# Patient Record
Sex: Male | Born: 1940 | Race: White | Hispanic: No | Smoking: Former smoker
Health system: Southern US, Community
[De-identification: ages and names within clinical notes are randomized; demographics above are authoritative.]

## PROBLEM LIST (undated history)

## (undated) DIAGNOSIS — N529 Male erectile dysfunction, unspecified: Secondary | ICD-10-CM

## (undated) DIAGNOSIS — K5792 Diverticulitis of intestine, part unspecified, without perforation or abscess without bleeding: Secondary | ICD-10-CM

## (undated) DIAGNOSIS — I10 Essential (primary) hypertension: Secondary | ICD-10-CM

## (undated) DIAGNOSIS — J342 Deviated nasal septum: Secondary | ICD-10-CM

## (undated) DIAGNOSIS — E7800 Pure hypercholesterolemia, unspecified: Secondary | ICD-10-CM

## (undated) DIAGNOSIS — N400 Enlarged prostate without lower urinary tract symptoms: Secondary | ICD-10-CM

## (undated) DIAGNOSIS — R350 Frequency of micturition: Secondary | ICD-10-CM

## (undated) DIAGNOSIS — I700 Atherosclerosis of aorta: Secondary | ICD-10-CM

## (undated) DIAGNOSIS — C61 Malignant neoplasm of prostate: Secondary | ICD-10-CM

## (undated) DIAGNOSIS — I779 Disorder of arteries and arterioles, unspecified: Secondary | ICD-10-CM

## (undated) HISTORY — PX: OTHER SURGICAL HISTORY: SHX169

## (undated) HISTORY — PX: VARIOCOCELE REPAIR: SHX242

## (undated) HISTORY — PX: TONSILLECTOMY: SUR1361

## (undated) HISTORY — DX: Malignant neoplasm of prostate: C61

## (undated) HISTORY — DX: Male erectile dysfunction, unspecified: N52.9

---

## 2000-12-20 ENCOUNTER — Ambulatory Visit (HOSPITAL_COMMUNITY): Admission: RE | Admit: 2000-12-20 | Discharge: 2000-12-20 | Payer: Self-pay | Admitting: Gastroenterology

## 2005-07-19 ENCOUNTER — Emergency Department (HOSPITAL_COMMUNITY): Admission: EM | Admit: 2005-07-19 | Discharge: 2005-07-20 | Payer: Self-pay | Admitting: Emergency Medicine

## 2006-04-26 ENCOUNTER — Encounter: Admission: RE | Admit: 2006-04-26 | Discharge: 2006-04-26 | Payer: Self-pay | Admitting: Internal Medicine

## 2012-04-14 ENCOUNTER — Other Ambulatory Visit: Payer: Self-pay | Admitting: Internal Medicine

## 2012-04-14 DIAGNOSIS — R109 Unspecified abdominal pain: Secondary | ICD-10-CM

## 2012-04-15 ENCOUNTER — Ambulatory Visit
Admission: RE | Admit: 2012-04-15 | Discharge: 2012-04-15 | Disposition: A | Discharge status: Home / Self Care | Payer: Medicare Other | Source: Ambulatory Visit | Attending: Internal Medicine | Admitting: Internal Medicine

## 2012-04-15 ENCOUNTER — Other Ambulatory Visit: Payer: Self-pay | Admitting: Internal Medicine

## 2012-04-15 DIAGNOSIS — R109 Unspecified abdominal pain: Secondary | ICD-10-CM

## 2012-04-15 MED ORDER — IOHEXOL 300 MG/ML  SOLN
100.0000 mL | Freq: Once | INTRAMUSCULAR | Status: AC | PRN
  Administered 2012-04-15: 100 mL via INTRAVENOUS

## 2012-06-28 DIAGNOSIS — C61 Malignant neoplasm of prostate: Secondary | ICD-10-CM

## 2012-06-28 HISTORY — DX: Malignant neoplasm of prostate: C61

## 2012-06-28 HISTORY — PX: PROSTATE BIOPSY: SHX241

## 2012-07-25 ENCOUNTER — Encounter: Payer: Self-pay | Admitting: Radiation Oncology

## 2012-07-26 ENCOUNTER — Ambulatory Visit
Admission: RE | Admit: 2012-07-26 | Discharge: 2012-07-26 | Disposition: A | Discharge status: Home / Self Care | Payer: Medicare Other | Source: Ambulatory Visit | Attending: Radiation Oncology | Admitting: Radiation Oncology

## 2012-07-26 ENCOUNTER — Encounter: Payer: Self-pay | Admitting: Radiation Oncology

## 2012-07-26 VITALS — BP 124/64 | HR 87 | Temp 98.9°F | Resp 20 | Ht 70.5 in | Wt 161.8 lb

## 2012-07-26 DIAGNOSIS — C61 Malignant neoplasm of prostate: Secondary | ICD-10-CM | POA: Insufficient documentation

## 2012-07-26 HISTORY — DX: Essential (primary) hypertension: I10

## 2012-07-26 HISTORY — DX: Pure hypercholesterolemia, unspecified: E78.00

## 2012-07-26 HISTORY — DX: Benign prostatic hyperplasia without lower urinary tract symptoms: N40.0

## 2012-07-26 NOTE — Progress Notes (Signed)
Please see the Nurse Progress Note in the MD Initial Consult Encounter for this patient. 

## 2012-07-26 NOTE — Progress Notes (Signed)
New Consult Prostate Cancer Biopsy 06/28/2012 Adenocarcinoma,gleason 3+4=-7 & 4+3=7, PSA=2.94,Volume=60.4cc PSA 05/28/11=2.07  Divorced,Retired, 1 son, alert,orientd x3, nocturia 2-3x  Doesn't feel emptying his bladder fully   Brother Prostate Cancer  Age 73,  Dx and treated with surgery  1,  2013 Mother unknown cancer (pelvic area?)  Allergies:Sulfa drugs,

## 2012-07-26 NOTE — Progress Notes (Signed)
[facility] Radiation Oncology NEW PATIENT EVALUATION  Name: Wesley Stanton MRN: [id number]  Date:   07/26/2012           DOB: 08/29/1940  Status: outpatient   CC:   Crecencio Mc, MD Dr. 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 male who is seen today for the courtesy of Dr. 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 He was followed by Dr. Wanda Plump for a number of years with BPH and lower urinary tract symptoms. He was noted to have a slight rise in his PSA from 2.07 in October 2012 to 2.94 in October of 2013. Dr. Laverle Patter was concerned that he knew prostate nodule towards the left medial/apex region. He underwent ultrasound-guided biopsies on 06/28/2012, was found have Gleason 7 (4+3) involving 50% of one core from the left base and Gleason 7 (3+4) involving 60% of one core from the left lateral base. High-grade PIN was seen from the right lateral apex and also left mid gland. His gland volume was 60.4 cc. He does have significant obstructive urinary symptoms despite taking doxazosin appear his I PSS score today is 22 (21 at Dr. Vevelyn Royals office). Doing well from a GI standpoint. He does have erectile dysfunction which can improve somewhat with Viagra. 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  PREVIOUS RADIATION THERAPY: No   PAST MEDICAL HISTORY:  has a past medical history of Prostate cancer (06/28/12); ED (erectile dysfunction); Hypertension; Hypercholesterolemia; and BPH (benign prostatic hyperplasia).     PAST SURGICAL HISTORY:  Past Surgical History  Procedure Date  . Prostate biopsy 06/28/12    Adenocarcinoma  . Tonsillectomy     as a child  . Variococele repair     [phone] repair     FAMILY HISTORY: His father died of "old age" at age 73. His mother died  following a stroke at 19. He believes that his father may have had some type of prostate problem but it is unclear as to whether not he had prostate cancer.  SOCIAL HISTORY:  reports that he has quit smoking. His smoking use included Cigarettes. He has a 10 pack-year smoking history. He does not have any smokeless tobacco history on file. He reports that he drinks alcohol. Divorced, one son. He worked as a Programme researcher, broadcasting/film/video.   ALLERGIES: Sulfa antibiotics   MEDICATIONS:  Current Outpatient Prescriptions  Medication Sig Dispense Refill  . aspirin 81 MG tablet Take 81 mg by mouth daily.      Marland Kitchen doxazosin (CARDURA) 8 MG tablet Take 8 mg by mouth at bedtime.       Marland Kitchen lisinopril (PRINIVIL,ZESTRIL) 10 MG tablet Take 10 mg by mouth daily.       . simvastatin (ZOCOR) 40 MG tablet Take 40 mg by mouth at bedtime.       . meloxicam (MOBIC) 15 MG tablet          REVIEW OF SYSTEMS:  Pertinent items are noted in HPI.    PHYSICAL EXAM:  height is 5' 10.5" (1.791 m) and weight is 161 lb 12.8 oz (73.392 kg). His oral temperature is 98.9 F (37.2 C). His blood pressure is 124/64 and his pulse is 87. His respiration is 20.      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 I explained to the patient that his prognosis is related to his stage, Gleason score, and PSA level. His stage and PSA level are favorable while his Gleason score of 7 is of intermediate favorability. Other prognostic factors include disease volume and PSA doubling time, and these are also favorable. His management options include surgery versus radiation therapy. I do not feel that he would be a good candidate for active surveillance in view of his Gleason 7 (4+3). He appears to have  a significant life expectancy. Radiation therapy options include possible seed implantation or external beam/IMRT. We discussed the potential acute and late toxicities of radiation therapy. I do not feel that he is a good candidate for seed implantation based on his obstructive symptomatology which would only worsen, at least temporarily and place him at risk for going into urinary retention. Even with external beam radiation therapy I think it would be quite symptomatic from a urinary obstructive standpoint and may end up in urinary retention as well. The patient is quite young, physiologically, I feel that he would have a better urinary outcome, and similar cure rate with robotic surgery with Dr. Laverle Patter compared to external beam/IMRT. The patient will get back in touch with Dr. Laverle Patter regarding his decision which believe will be robotic surgery. She also understands that there is a chance that he may need to have post operative radiation therapy should he be found to be at increased risk for local recurrence based on his pathologic stage.   PLAN: As discussed above.   I spent 60 minutes minutes face to face with the patient and more than 50% of that time was spent in counseling and/or coordination of care.

## 2012-07-28 ENCOUNTER — Other Ambulatory Visit: Payer: Self-pay | Admitting: Urology

## 2012-07-29 NOTE — Addendum Note (Signed)
Encounter addended by: Alyah Boehning Mintz Kayliah Tindol, RN on: 07/29/2012  6:17 PM<BR>     Documentation filed: Charges VN

## 2012-08-01 ENCOUNTER — Encounter (HOSPITAL_COMMUNITY): Payer: Self-pay

## 2012-08-04 ENCOUNTER — Encounter (HOSPITAL_COMMUNITY): Payer: Self-pay

## 2012-08-04 ENCOUNTER — Ambulatory Visit (HOSPITAL_COMMUNITY)
Admission: RE | Admit: 2012-08-04 | Discharge: 2012-08-04 | Disposition: A | Discharge status: Home / Self Care | Payer: Medicare Other | Source: Ambulatory Visit | Attending: Urology | Admitting: Urology

## 2012-08-04 ENCOUNTER — Encounter (HOSPITAL_COMMUNITY)
Admission: RE | Admit: 2012-08-04 | Discharge: 2012-08-04 | Disposition: A | Discharge status: Home / Self Care | Payer: Medicare Other | Source: Ambulatory Visit | Attending: Urology | Admitting: Urology

## 2012-08-04 DIAGNOSIS — Z01818 Encounter for other preprocedural examination: Secondary | ICD-10-CM | POA: Insufficient documentation

## 2012-08-04 HISTORY — DX: Diverticulitis of intestine, part unspecified, without perforation or abscess without bleeding: K57.92

## 2012-08-04 HISTORY — DX: Frequency of micturition: R35.0

## 2012-08-04 HISTORY — DX: Deviated nasal septum: J34.2

## 2012-08-04 LAB — CBC
HCT: 42.9 % (ref 39.0–52.0)
Hemoglobin: 14.9 g/dL (ref 13.0–17.0)
MCH: 32.8 pg (ref 26.0–34.0)
MCHC: 34.7 g/dL (ref 30.0–36.0)
MCV: 94.5 fL (ref 78.0–100.0)
Platelets: 145 10*3/uL — ABNORMAL LOW (ref 150–400)
RBC: 4.54 MIL/uL (ref 4.22–5.81)
RDW: 12.2 % (ref 11.5–15.5)
WBC: 6.9 10*3/uL (ref 4.0–10.5)

## 2012-08-04 LAB — BASIC METABOLIC PANEL
BUN: 13 mg/dL (ref 6–23)
CO2: 27 mEq/L (ref 19–32)
Calcium: 9.9 mg/dL (ref 8.4–10.5)
Chloride: 100 mEq/L (ref 96–112)
Creatinine, Ser: 0.79 mg/dL (ref 0.50–1.35)
GFR calc Af Amer: >90 mL/min (ref >90)
GFR calc non Af Amer: 87 mL/min — ABNORMAL LOW (ref >90)
Glucose, Bld: 106 mg/dL — ABNORMAL HIGH (ref 70–99)
Potassium: 4.6 mEq/L (ref 3.5–5.1)
Sodium: 135 mEq/L (ref 135–145)

## 2012-08-04 LAB — SURGICAL PCR SCREEN
MRSA, PCR: NEGATIVE
Staphylococcus aureus: NEGATIVE

## 2012-08-04 NOTE — Pre-Procedure Instructions (Signed)
PREOP CBC, BMET, EKG, CXR WERE DONE TODAY AT [facility] PREOP - AS PER ORDERS DR. BORDEN.  T/S TO BE DONE ON ARRIVAL FOR SURGERY.

## 2012-08-04 NOTE — Patient Instructions (Addendum)
YOUR SURGERY IS SCHEDULED AT [facility]  ON:  Thursday  JAN 2nd  REPORT TO [facility]ORT STAY C[facility] AT: 5:15 AM      PHONE # FOR SHORT STAY IS [phone]  FOLLOW BOWEL PREP INSTRUCTIONS / CLEAR LIQUIDS DAY BEFORE SURGERY --INSTRUCTIONS WERE GIVEN BY DR. Vevelyn Royals OFFICE.  DO NOT EAT OR DRINK ANYTHING AFTER MIDNIGHT THE NIGHT BEFORE YOUR SURGERY.  YOU MAY BRUSH YOUR TEETH, RINSE OUT YOUR MOUTH--BUT NO WATER, NO FOOD, NO CHEWING GUM, NO MINTS, NO CANDIES, NO CHEWING TOBACCO.  PLEASE TAKE THE FOLLOWING MEDICATIONS THE AM OF YOUR SURGERY WITH A FEW SIPS OF WATER: NO MEDICINES TO TAKE.  IF YOU USE INHALERS--USE YOUR INHALERS THE AM OF YOUR SURGERY AND BRING INHALERS TO THE HOSPITAL -TAKE TO SURGERY.    IF YOU ARE DIABETIC:  DO NOT TAKE ANY DIABETIC MEDICATIONS THE AM OF YOUR SURGERY.  IF YOU TAKE INSULIN IN THE EVENINGS--PLEASE ONLY TAKE 1/2 NORMAL EVENING DOSE THE NIGHT BEFORE YOUR SURGERY.  NO INSULIN THE AM OF YOUR SURGERY.  IF YOU HAVE SLEEP APNEA AND USE CPAP OR BIPAP--PLEASE BRING THE MASK AND THE TUBING.  DO NOT BRING YOUR MACHINE.  DO NOT BRING VALUABLES, MONEY, CREDIT CARDS.  DO NOT WEAR JEWELRY, MAKE-UP, NAIL POLISH AND NO METAL PINS OR CLIPS IN YOUR HAIR. CONTACT LENS, DENTURES / PARTIALS, GLASSES SHOULD NOT BE WORN TO SURGERY AND IN MOST CASES-HEARING AIDS WILL NEED TO BE REMOVED.  BRING YOUR GLASSES CASE, ANY EQUIPMENT NEEDED FOR YOUR CONTACT LENS. FOR PATIENTS ADMITTED TO THE HOSPITAL--CHECK OUT TIME THE DAY OF DISCHARGE IS 11:00 AM.  ALL INPATIENT ROOMS ARE PRIVATE - WITH BATHROOM, TELEPHONE, TELEVISION AND WIFI INTERNET.  IF YOU ARE BEING DISCHARGED THE SAME DAY OF YOUR SURGERY--YOU CAN NOT DRIVE YOURSELF HOME--AND SHOULD NOT GO HOME ALONE BY TAXI OR BUS.  NO DRIVING OR OPERATING MACHINERY FOR 24 HOURS FOLLOWING ANESTHESIA / PAIN MEDICATIONS.  PLEASE MAKE ARRANGEMENTS FOR SOMEONE TO BE WITH YOU AT HOME THE FIRST 24 HOURS AFTER SURGERY. RESPONSIBLE DRIVER'S  NAME___________________________                                               PHONE #   _______________________                               PLEASE READ OVER ANY  FACT SHEETS THAT YOU WERE GIVEN: MRSA INFORMATION, BLOOD TRANSFUSION INFORMATION, INCENTIVE SPIROMETER INFORMATION. FAILURE TO FOLLOW THESE INSTRUCTIONS MAY RESULT IN THE CANCELLATION OF YOUR SURGERY.   PATIENT SIGNATURE_________________________________

## 2012-08-10 MED ORDER — CEFAZOLIN SODIUM-DEXTROSE 2-3 GM-% IV SOLR
2.0000 g | INTRAVENOUS | Status: AC
  Administered 2012-08-11: 2 g via INTRAVENOUS

## 2012-08-10 NOTE — H&P (Signed)
  History of Present Illness  Wesley Stanton is a 72 year old previously followed by Dr. Wanda Plump with the following urologic history:  1) Prostate Cancer: He was noted to have a new prostate nodule at the left mid prostate gland in October 2013 prompting a prostate biopsy which confirmed Gleason 4+3=7 adenocarcinoma with 2 out of 12 biopsy cores positive with both positive cores on the left side of the prostate. He has a family history of prostate cancer with his brother having been diagnosed and treated in 2013. He does have a history of BPH with moderate symptoms on maximum alpha blocker therapy. He is exceedingly healthy and active.  TNM stage: cT2a Nx Mx PSA: 2.94 Gleason score: 4+3=7 Biopsy (06/28/12): 2/12 cores positive Prostate volume: 60.4 cc  Nomogram OC disease: 73% EPE: 35% SVI: 1% LNI: 2.1% PFS (surgery): 95%, 93%  Urinary function: IPSS is 21 on doxazosin 8 mg. Erectile function:  2) BPH/LUTS: He has long-standing symptoms which at baseline include a sense of incomplete emptying, urinary frequency, urgency, weak stream, and nocturia. Nocturia is his most bothersome symptom at baseline he gets up 3-4 times at night.  Current treatment: Doxazosin 8 mg Prior treatment: Tamsulosin (discontinued due to GI side effects)  3) Microscopic hematuria: He underwent a complete urologic evaluation by Dr. Wanda Plump in 2006 which was negative.  4) Erectile dysfunction: He has mild erectile dysfunction and occasionally takes Viagra.  5) Varicocele/infertility: He is status post a varicocele repair in the 1970s and subsequently conceived following this repair.    Past Medical History Problems  1. History of  Glaucoma 365.9 2. History of  Hypercholesterolemia 272.0 3. History of  Hypertension 401.9  Surgical History Problems  1. History of  Surgery Spermatic Cord Excision Of Varicocele  Current Meds 1. Adult Aspirin Low Strength 81 MG Oral Tablet Dispersible; Therapy:  (Recorded:20Dec2007) to 2. Doxazosin Mesylate 8 MG Oral Tablet; TAKE ONE TABLET BY MOUTH AT BEDTIME; Therapy:  14Nov2011 to (Evaluate:09Oct2014)  Requested for: 14Oct2013; Last Rx:14Oct2013 3. Lisinopril 10 MG Oral Tablet; Therapy: (Recorded:26Oct2011) to 4. Simvastatin 40 MG Oral Tablet; Therapy: 14Mar2011 to  Allergies Medication  1. Sulfa Drugs  Family History Problems  1. Paternal history of  Nephrolithiasis 2. Fraternal history of  Prostate Cancer V16.42 3. Maternal history of  Uterine Cancer  Social History Problems  1. Alcohol Use Wine 3/day 2. Marital History - Divorced 3. Occupation: Retired 4. Tobacco Use V15.82 Social, quit 20+ yrs. ago  Vitals Vital Signs [Data Includes: Last 1 Day]  26Nov2013 01:11PM  Blood Pressure: 121 / 65 Heart Rate: 76  Physical Exam Constitutional: Well nourished and well developed . No acute distress.     Discussion/Summary  1. Clinically localized prostate cancer:  He will undergo a robotic assisted laparoscopic radical prostatectomy with BPLND.

## 2012-08-11 ENCOUNTER — Encounter (HOSPITAL_COMMUNITY): Payer: Self-pay | Admitting: *Deleted

## 2012-08-11 ENCOUNTER — Encounter (HOSPITAL_COMMUNITY)
Admission: RE | Disposition: A | Discharge status: Home / Self Care | Payer: Self-pay | Source: Ambulatory Visit | Attending: Urology

## 2012-08-11 ENCOUNTER — Observation Stay (HOSPITAL_COMMUNITY)
Admission: RE | Admit: 2012-08-11 | Discharge: 2012-08-12 | Disposition: A | Discharge status: Home / Self Care | Payer: Medicare Other | Source: Ambulatory Visit | Attending: Urology | Admitting: Urology

## 2012-08-11 ENCOUNTER — Encounter (HOSPITAL_COMMUNITY): Payer: Self-pay | Admitting: Certified Registered Nurse Anesthetist

## 2012-08-11 ENCOUNTER — Inpatient Hospital Stay (HOSPITAL_COMMUNITY): Payer: Medicare Other | Admitting: Certified Registered Nurse Anesthetist

## 2012-08-11 DIAGNOSIS — C61 Malignant neoplasm of prostate: Principal | ICD-10-CM | POA: Insufficient documentation

## 2012-08-11 DIAGNOSIS — R3129 Other microscopic hematuria: Secondary | ICD-10-CM | POA: Insufficient documentation

## 2012-08-11 DIAGNOSIS — Z79899 Other long term (current) drug therapy: Secondary | ICD-10-CM | POA: Insufficient documentation

## 2012-08-11 DIAGNOSIS — N401 Enlarged prostate with lower urinary tract symptoms: Secondary | ICD-10-CM | POA: Insufficient documentation

## 2012-08-11 DIAGNOSIS — H409 Unspecified glaucoma: Secondary | ICD-10-CM | POA: Insufficient documentation

## 2012-08-11 DIAGNOSIS — Z7982 Long term (current) use of aspirin: Secondary | ICD-10-CM | POA: Insufficient documentation

## 2012-08-11 DIAGNOSIS — E7800 Pure hypercholesterolemia, unspecified: Secondary | ICD-10-CM | POA: Insufficient documentation

## 2012-08-11 DIAGNOSIS — I10 Essential (primary) hypertension: Secondary | ICD-10-CM | POA: Insufficient documentation

## 2012-08-11 DIAGNOSIS — N529 Male erectile dysfunction, unspecified: Secondary | ICD-10-CM | POA: Insufficient documentation

## 2012-08-11 DIAGNOSIS — N138 Other obstructive and reflux uropathy: Secondary | ICD-10-CM | POA: Insufficient documentation

## 2012-08-11 HISTORY — PX: ROBOT ASSISTED LAPAROSCOPIC RADICAL PROSTATECTOMY: SHX5141

## 2012-08-11 HISTORY — PX: LYMPHADENECTOMY: SHX5960

## 2012-08-11 LAB — TYPE AND SCREEN
ABO/RH(D): A NEG
Antibody Screen: NEGATIVE

## 2012-08-11 LAB — HEMOGLOBIN AND HEMATOCRIT, BLOOD
HCT: 35.9 % — ABNORMAL LOW (ref 39.0–52.0)
Hemoglobin: 12.9 g/dL — ABNORMAL LOW (ref 13.0–17.0)

## 2012-08-11 LAB — ABO/RH: ABO/RH(D): A NEG

## 2012-08-11 SURGERY — ROBOTIC ASSISTED LAPAROSCOPIC RADICAL PROSTATECTOMY LEVEL 2
Anesthesia: General | Site: Prostate | Wound class: Clean Contaminated

## 2012-08-11 MED ORDER — DOCUSATE SODIUM 100 MG PO CAPS
100.0000 mg | ORAL_CAPSULE | Freq: Two times a day (BID) | ORAL | Status: DC
  Administered 2012-08-11 – 2012-08-12 (×2): 100 mg via ORAL
  Filled 2012-08-11 (×3): qty 1

## 2012-08-11 MED ORDER — PROMETHAZINE HCL 25 MG/ML IJ SOLN: 6.2500 mg | INTRAMUSCULAR | Status: DC | PRN

## 2012-08-11 MED ORDER — CIPROFLOXACIN HCL 500 MG PO TABS: 500.0000 mg | ORAL_TABLET | Freq: Two times a day (BID) | ORAL | Status: DC

## 2012-08-11 MED ORDER — LACTATED RINGERS IV SOLN
INTRAVENOUS | Status: DC | PRN
  Administered 2012-08-11: 07:00:00 via INTRAVENOUS

## 2012-08-11 MED ORDER — PROPOFOL 10 MG/ML IV BOLUS
INTRAVENOUS | Status: DC | PRN
  Administered 2012-08-11: 160 mg via INTRAVENOUS

## 2012-08-11 MED ORDER — LACTATED RINGERS IV SOLN
INTRAVENOUS | Status: DC | PRN
  Administered 2012-08-11: 08:00:00

## 2012-08-11 MED ORDER — MIDAZOLAM HCL 5 MG/5ML IJ SOLN
INTRAMUSCULAR | Status: DC | PRN
  Administered 2012-08-11: 2 mg via INTRAVENOUS

## 2012-08-11 MED ORDER — KCL IN DEXTROSE-NACL 20-5-0.45 MEQ/L-%-% IV SOLN
INTRAVENOUS | Status: DC
  Administered 2012-08-11 (×2): via INTRAVENOUS
  Administered 2012-08-11: 1000 mL via INTRAVENOUS
  Administered 2012-08-12: 06:00:00 via INTRAVENOUS
  Filled 2012-08-11 (×5): qty 1000

## 2012-08-11 MED ORDER — MORPHINE SULFATE 2 MG/ML IJ SOLN: 2.0000 mg | INTRAMUSCULAR | Status: DC | PRN

## 2012-08-11 MED ORDER — BUPIVACAINE-EPINEPHRINE 0.25% -1:200000 IJ SOLN
INTRAMUSCULAR | Status: DC | PRN
  Administered 2012-08-11: 27 mL

## 2012-08-11 MED ORDER — HYDROMORPHONE HCL PF 1 MG/ML IJ SOLN
0.2500 mg | INTRAMUSCULAR | Status: DC | PRN
  Administered 2012-08-11 (×4): 0.5 mg via INTRAVENOUS

## 2012-08-11 MED ORDER — HYDROMORPHONE HCL PF 1 MG/ML IJ SOLN
INTRAMUSCULAR | Status: DC | PRN
  Administered 2012-08-11 (×2): .5 mg via INTRAVENOUS
  Administered 2012-08-11: 1 mg via INTRAVENOUS

## 2012-08-11 MED ORDER — SODIUM CHLORIDE 0.9 % IV BOLUS (SEPSIS)
1000.0000 mL | Freq: Once | INTRAVENOUS | Status: AC
  Administered 2012-08-11: 1000 mL via INTRAVENOUS

## 2012-08-11 MED ORDER — NEOSTIGMINE METHYLSULFATE 1 MG/ML IJ SOLN
INTRAMUSCULAR | Status: DC | PRN
  Administered 2012-08-11: 4 mg via INTRAVENOUS

## 2012-08-11 MED ORDER — EPHEDRINE SULFATE 50 MG/ML IJ SOLN
INTRAMUSCULAR | Status: DC | PRN
  Administered 2012-08-11 (×2): 5 mg via INTRAVENOUS

## 2012-08-11 MED ORDER — ACETAMINOPHEN 10 MG/ML IV SOLN
INTRAVENOUS | Status: DC | PRN
  Administered 2012-08-11: 1000 mg via INTRAVENOUS

## 2012-08-11 MED ORDER — DIPHENHYDRAMINE HCL 12.5 MG/5ML PO ELIX: 12.5000 mg | ORAL_SOLUTION | Freq: Four times a day (QID) | ORAL | Status: DC | PRN

## 2012-08-11 MED ORDER — ROCURONIUM BROMIDE 100 MG/10ML IV SOLN
INTRAVENOUS | Status: DC | PRN
  Administered 2012-08-11: 10 mg via INTRAVENOUS
  Administered 2012-08-11: 50 mg via INTRAVENOUS

## 2012-08-11 MED ORDER — INDIGOTINDISULFONATE SODIUM 8 MG/ML IJ SOLN
INTRAMUSCULAR | Status: DC | PRN
  Administered 2012-08-11 (×2): 5 mL via INTRAVENOUS

## 2012-08-11 MED ORDER — STERILE WATER FOR IRRIGATION IR SOLN
Status: DC | PRN
  Administered 2012-08-11: 1500 mL

## 2012-08-11 MED ORDER — DOXAZOSIN MESYLATE 8 MG PO TABS
8.0000 mg | ORAL_TABLET | Freq: Every day | ORAL | Status: DC
  Administered 2012-08-11: 8 mg via ORAL
  Filled 2012-08-11 (×2): qty 1

## 2012-08-11 MED ORDER — GLYCOPYRROLATE 0.2 MG/ML IJ SOLN
INTRAMUSCULAR | Status: DC | PRN
  Administered 2012-08-11: 0.6 mg via INTRAVENOUS

## 2012-08-11 MED ORDER — HYDROCODONE-ACETAMINOPHEN 5-325 MG PO TABS: 1.0000 | ORAL_TABLET | Freq: Four times a day (QID) | ORAL | Status: DC | PRN

## 2012-08-11 MED ORDER — ACETAMINOPHEN 325 MG PO TABS
650.0000 mg | ORAL_TABLET | ORAL | Status: DC | PRN
  Administered 2012-08-12: 650 mg via ORAL
  Filled 2012-08-11: qty 2

## 2012-08-11 MED ORDER — SIMVASTATIN 40 MG PO TABS
40.0000 mg | ORAL_TABLET | Freq: Every day | ORAL | Status: DC
  Administered 2012-08-11: 40 mg via ORAL
  Filled 2012-08-11 (×2): qty 1

## 2012-08-11 MED ORDER — LIDOCAINE HCL (CARDIAC) 20 MG/ML IV SOLN
INTRAVENOUS | Status: DC | PRN
  Administered 2012-08-11: 80 mg via INTRAVENOUS

## 2012-08-11 MED ORDER — FENTANYL CITRATE 0.05 MG/ML IJ SOLN
INTRAMUSCULAR | Status: DC | PRN
  Administered 2012-08-11 (×5): 50 ug via INTRAVENOUS

## 2012-08-11 MED ORDER — DIPHENHYDRAMINE HCL 50 MG/ML IJ SOLN: 12.5000 mg | Freq: Four times a day (QID) | INTRAMUSCULAR | Status: DC | PRN

## 2012-08-11 MED ORDER — KETOROLAC TROMETHAMINE 15 MG/ML IJ SOLN
15.0000 mg | Freq: Four times a day (QID) | INTRAMUSCULAR | Status: DC
  Administered 2012-08-11 – 2012-08-12 (×4): 15 mg via INTRAVENOUS
  Filled 2012-08-11 (×6): qty 1

## 2012-08-11 MED ORDER — CEFAZOLIN SODIUM 1-5 GM-% IV SOLN
1.0000 g | Freq: Three times a day (TID) | INTRAVENOUS | Status: AC
  Administered 2012-08-11 (×2): 1 g via INTRAVENOUS
  Filled 2012-08-11 (×2): qty 50

## 2012-08-11 MED ORDER — ONDANSETRON HCL 4 MG/2ML IJ SOLN
INTRAMUSCULAR | Status: DC | PRN
  Administered 2012-08-11: 4 mg via INTRAVENOUS

## 2012-08-11 SURGICAL SUPPLY — 42 items
CANISTER SUCTION 2500CC (MISCELLANEOUS) ×3 IMPLANT
CATH FOLEY 2WAY SLVR 18FR 30CC (CATHETERS) ×1 IMPLANT
CATH ROBINSON RED A/P 16FR (CATHETERS) ×1 IMPLANT
CATH ROBINSON RED A/P 8FR (CATHETERS) ×3 IMPLANT
CATH TIEMANN FOLEY 18FR 5CC (CATHETERS) ×1 IMPLANT
CHLORAPREP W/TINT 26ML (MISCELLANEOUS) ×3 IMPLANT
CLIP LIGATING HEM O LOK PURPLE (MISCELLANEOUS) ×6 IMPLANT
CLOTH BEACON ORANGE TIMEOUT ST (SAFETY) ×3 IMPLANT
CORD HIGH FREQUENCY UNIPOLAR (ELECTROSURGICAL) ×3 IMPLANT
COVER SURGICAL LIGHT HANDLE (MISCELLANEOUS) ×3 IMPLANT
COVER TIP SHEARS 8 DVNC (MISCELLANEOUS) ×2 IMPLANT
COVER TIP SHEARS 8MM DA VINCI (MISCELLANEOUS) ×1
CUTTER ECHEON FLEX ENDO 45 340 (ENDOMECHANICALS) ×3 IMPLANT
DECANTER SPIKE VIAL GLASS SM (MISCELLANEOUS) ×2 IMPLANT
DRAPE SURG IRRIG POUCH 19X23 (DRAPES) ×3 IMPLANT
DRSG TEGADERM 2-3/8X2-3/4 SM (GAUZE/BANDAGES/DRESSINGS) ×15 IMPLANT
DRSG TEGADERM 4X4.75 (GAUZE/BANDAGES/DRESSINGS) ×4 IMPLANT
DRSG TEGADERM 6X8 (GAUZE/BANDAGES/DRESSINGS) ×6 IMPLANT
ELECT REM PT RETURN 9FT ADLT (ELECTROSURGICAL) ×3
ELECTRODE REM PT RTRN 9FT ADLT (ELECTROSURGICAL) ×2 IMPLANT
GLOVE BIO SURGEON STRL SZ 6.5 (GLOVE) ×3 IMPLANT
GLOVE BIOGEL M STRL SZ7.5 (GLOVE) ×6 IMPLANT
GOWN STRL NON-REIN LRG LVL3 (GOWN DISPOSABLE) ×8 IMPLANT
GOWN STRL REIN XL XLG (GOWN DISPOSABLE) ×7 IMPLANT
HOLDER FOLEY CATH W/STRAP (MISCELLANEOUS) ×3 IMPLANT
IV LACTATED RINGERS 1000ML (IV SOLUTION) ×2 IMPLANT
KIT ACCESSORY DA VINCI DISP (KITS) ×1
KIT ACCESSORY DVNC DISP (KITS) ×2 IMPLANT
NDL SAFETY ECLIPSE 18X1.5 (NEEDLE) ×2 IMPLANT
NEEDLE HYPO 18GX1.5 SHARP (NEEDLE) ×3
PACK ROBOT UROLOGY CUSTOM (CUSTOM PROCEDURE TRAY) ×3 IMPLANT
RELOAD GREEN ECHELON 45 (STAPLE) ×3 IMPLANT
SET TUBE IRRIG SUCTION NO TIP (IRRIGATION / IRRIGATOR) ×3 IMPLANT
SOLUTION ELECTROLUBE (MISCELLANEOUS) ×3 IMPLANT
SPONGE GAUZE 4X4 12PLY (GAUZE/BANDAGES/DRESSINGS) ×2 IMPLANT
SUT ETHILON 3 0 PS 1 (SUTURE) ×3 IMPLANT
SUT VICRYL 0 UR6 27IN ABS (SUTURE) ×6 IMPLANT
SYR 27GX1/2 1ML LL SAFETY (SYRINGE) ×3 IMPLANT
TOWEL OR 17X26 10 PK STRL BLUE (TOWEL DISPOSABLE) ×3 IMPLANT
TOWEL OR NON WOVEN STRL DISP B (DISPOSABLE) ×3 IMPLANT
TROCAR XCEL 12X100 BLDLESS (ENDOMECHANICALS) ×1 IMPLANT
WATER STERILE IRR 1500ML POUR (IV SOLUTION) ×4 IMPLANT

## 2012-08-11 NOTE — Anesthesia Preprocedure Evaluation (Signed)
Anesthesia Evaluation  Patient identified by MRN, date of birth, ID band Patient awake    Reviewed: Allergy & Precautions, H&P , NPO status , Patient's Chart, lab work & pertinent test results  Airway Mallampati: II TM Distance: >3 FB Neck ROM: Full    Dental No notable dental hx.    Pulmonary neg pulmonary ROS,  breath sounds clear to auscultation  Pulmonary exam normal       Cardiovascular hypertension, Pt. on medications Rhythm:Regular Rate:Normal     Neuro/Psych negative neurological ROS  negative psych ROS   GI/Hepatic negative GI ROS, Neg liver ROS,   Endo/Other  negative endocrine ROS  Renal/GU negative Renal ROS  negative genitourinary   Musculoskeletal negative musculoskeletal ROS (+)   Abdominal   Peds negative pediatric ROS (+)  Hematology negative hematology ROS (+)   Anesthesia Other Findings   Reproductive/Obstetrics negative OB ROS                           Anesthesia Physical Anesthesia Plan  ASA: II  Anesthesia Plan: General   Post-op Pain Management:    Induction: Intravenous  Airway Management Planned: Oral ETT  Additional Equipment:   Intra-op Plan:   Post-operative Plan: Extubation in OR  Informed Consent: I have reviewed the patients History and Physical, chart, labs and discussed the procedure including the risks, benefits and alternatives for the proposed anesthesia with the patient or authorized representative who has indicated his/her understanding and acceptance.   Dental advisory given  Plan Discussed with: CRNA and Surgeon  Anesthesia Plan Comments:         Anesthesia Quick Evaluation  

## 2012-08-11 NOTE — Op Note (Signed)
Preoperative diagnosis: Clinically localized adenocarcinoma of the prostate (clinical stage cT2a Nx Mx)  Postoperative diagnosis: Clinically localized adenocarcinoma of the prostate (clinical stage cT2a Nx Mx)  Procedure:  1. Robotic assisted laparoscopic radical prostatectomy (bilateral nerve sparing) 2. Bilateral robotic assisted laparoscopic pelvic lymphadenectomy  Surgeon: Moody Bruins. M.D.  Assistant: Pecola Leisure, PA-C  Anesthesia: General  Complications: None  EBL: 50 mL  IVF:  1000 mL crystalloid  Specimens: 1. Prostate and seminal vesicles 2. Right pelvic lymph nodes 3. Left pelvic lymph nodes  Disposition of specimens: Pathology  Drains: 1. 20 Fr coude catheter 2. # 19 Blake pelvic drain  Indication: Wesley Stanton is a 72 y.o. year old patient with clinically localized prostate cancer.  After a thorough review of the management options for treatment of prostate cancer, he elected to proceed with surgical therapy and the above procedure(s).  We have discussed the potential benefits and risks of the procedure, side effects of the proposed treatment, the likelihood of the patient achieving the goals of the procedure, and any potential problems that might occur during the procedure or recuperation. Informed consent has been obtained.  Description of procedure:  The patient was taken to the operating room and a general anesthetic was administered. He was given preoperative antibiotics, placed in the dorsal lithotomy position, and prepped and draped in the usual sterile fashion. Next a preoperative timeout was performed. A urethral catheter was placed into the bladder and a site was selected near the umbilicus for placement of the camera port. This was placed using a standard open Hassan technique which allowed entry into the peritoneal cavity under direct vision and without difficulty. A 12 mm port was placed and a pneumoperitoneum established. The camera  was then used to inspect the abdomen and there was no evidence of any intra-abdominal injuries or other abnormalities. The remaining abdominal ports were then placed. 8 mm robotic ports were placed in the right lower quadrant, left lower quadrant, and far left lateral abdominal wall. A 5 mm port was placed in the right upper quadrant and a 12 mm port was placed in the right lateral abdominal wall for laparoscopic assistance. All ports were placed under direct vision without difficulty. The surgical cart was then docked.   Utilizing the cautery scissors, the bladder was reflected posteriorly allowing entry into the space of Retzius and identification of the endopelvic fascia and prostate. The periprostatic fat was then removed from the prostate allowing full exposure of the endopelvic fascia. The endopelvic fascia was then incised from the apex back to the base of the prostate bilaterally and the underlying levator muscle fibers were swept laterally off the prostate thereby isolating the dorsal venous complex. The dorsal vein was then stapled and divided with a 45 mm Flex Echelon stapler. Attention then turned to the bladder neck which was divided anteriorly thereby allowing entry into the bladder and exposure of the urethral catheter. The catheter balloon was deflated and the catheter was brought into the operative field and used to retract the prostate anteriorly. The posterior bladder neck was then examined and was divided allowing further dissection between the bladder and prostate posteriorly until the vasa deferentia and seminal vessels were identified. The vasa deferentia were isolated, divided, and lifted anteriorly. The seminal vesicles were dissected down to their tips with care to control the seminal vascular arterial blood supply. These structures were then lifted anteriorly and the space between Denonvillier's fascia and the anterior rectum was developed with a combination  of sharp and blunt  dissection. This isolated the vascular pedicles of the prostate.  The lateral prostatic fascia was then sharply incised allowing release of the neurovascular bundles bilaterally. The vascular pedicles of the prostate were then ligated with Weck clips between the prostate and neurovascular bundles and divided with sharp cold scissor dissection resulting in neurovascular bundle preservation. The neurovascular bundles were then separated off the apex of the prostate and urethra bilaterally.  The urethra was then sharply transected allowing the prostate specimen to be disarticulated. The pelvis was copiously irrigated and hemostasis was ensured. There was no evidence for rectal injury.  Attention then turned to the right pelvic sidewall. The fibrofatty tissue between the external iliac vein, confluence of the iliac vessels, hypogastric artery, and Cooper's ligament was dissected free from the pelvic sidewall with care to preserve the obturator nerve. Weck clips were used for lymphostasis and hemostasis. An identical procedure was performed on the contralateral side and the lymphatic packets were removed for permanent pathologic analysis.  Attention then turned to the urethral anastomosis. A 2-0 Vicryl slip knot was placed between Denonvillier's fascia, the posterior bladder neck, and the posterior urethra to reapproximate these structures. A double-armed 3-0 Monocryl suture was then used to perform a 360 running tension-free anastomosis between the bladder neck and urethra. A new urethral catheter was then placed into the bladder and irrigated. There were no blood clots within the bladder and the anastomosis appeared to be watertight. A #19 Blake drain was then brought through the left lateral 8 mm port site and positioned appropriately within the pelvis. It was secured to the skin with a nylon suture. The surgical cart was then undocked. The right lateral 12 mm port site was closed at the fascial level with a  0 Vicryl suture placed laparoscopically. All remaining ports were then removed under direct vision. The prostate specimen was removed intact within the Endopouch retrieval bag via the periumbilical camera port site. This fascial opening was closed with two running 0 Vicryl sutures. 0.25% Marcaine was then injected into all port sites and all incisions were reapproximated at the skin level with staples. Sterile dressings were applied. The patient appeared to tolerate the procedure well and without complications. The patient was able to be extubated and transferred to the recovery unit in satisfactory condition.   Moody Bruins MD

## 2012-08-11 NOTE — Anesthesia Procedure Notes (Signed)
Procedure Name: Intubation Date/Time: 08/11/2012 6:50 AM Performed by: Mechele Dawley Pre-anesthesia Checklist: Patient identified, Emergency Drugs available, Suction available and Patient being monitored Patient Re-evaluated:Patient Re-evaluated prior to inductionOxygen Delivery Method: Circle system utilized Preoxygenation: Pre-oxygenation with 100% oxygen Intubation Type: IV induction Ventilation: Mask ventilation without difficulty Laryngoscope Size: Miller and 2 Grade View: Grade II Tube type: Oral Tube size: 7.5 mm Number of attempts: 1 Airway Equipment and Method: Stylet Placement Confirmation: ETT inserted through vocal cords under direct vision,  positive ETCO2 and breath sounds checked- equal and bilateral Secured at: 23 cm Tube secured with: Tape Dental Injury: Teeth and Oropharynx as per pre-operative assessment

## 2012-08-11 NOTE — Transfer of Care (Signed)
Immediate Anesthesia Transfer of Care Note  Patient: Wesley Stanton  Procedure(s) Performed: Procedure(s) (LRB): ROBOTIC ASSISTED LAPAROSCOPIC RADICAL PROSTATECTOMY LEVEL 2 (N/A) LYMPHADENECTOMY (Bilateral)  Patient Location: PACU  Anesthesia Type: General  Level of Consciousness: sedated, patient cooperative and responds to stimulaton  Airway & Oxygen Therapy: Patient Spontanous Breathing and Patient connected to face mask oxgen  Post-op Assessment: Report given to PACU RN and Post -op Vital signs reviewed and stable  Post vital signs: Reviewed and stable  Complications: No apparent anesthesia complications

## 2012-08-11 NOTE — Progress Notes (Signed)
Day of Surgery Subjective: Patient reports pain control good.  No complaints  Objective: Vital signs in last 24 hours: Temp:  [97.3 F (36.3 C)-99 F (37.2 C)] 97.3 F (36.3 C) (01/02 1056) Pulse Rate:  [86-101] 87  (01/02 1056) Resp:  [10-18] 12  (01/02 1056) BP: (121-150)/(52-74) 150/63 mmHg (01/02 1056) SpO2:  [100 %] 100 % (01/02 1056) Weight:  [71.124 kg (156 lb 12.8 oz)] 71.124 kg (156 lb 12.8 oz) (01/02 1100)  Intake/Output from previous day:   Intake/Output this shift: Total I/O In: 2150 [I.V.:1150; IV Piggyback:1000] Out: 165 [Urine:75; Drains:40; Blood:50]  Physical Exam:  General:alert, cooperative and no distress Cardiovascular:  Lungs: breathing regular and unlabored GI: soft Incisions: dressings intact Urine: blue Extremities: SCDs in place  Lab Results:  Basename 08/11/12 1030  HGB 12.9*  HCT 35.9*   BMET No results found for this basename: NA:2,K:2,CL:2,CO2:2,GLUCOSE:2,BUN:2,CREATININE:2,CALCIUM:2 in the last 72 hours No results found for this basename: LABPT:3,INR:3 in the last 72 hours No results found for this basename: LABURIN:1 in the last 72 hours Results for orders placed during the hospital encounter of 08/04/12  SURGICAL PCR SCREEN     Status: Normal   Collection Time   08/04/12  8:31 AM      Component Value Range Status Comment   MRSA, PCR NEGATIVE  NEGATIVE Final    Staphylococcus aureus NEGATIVE  NEGATIVE Final     Studies/Results: No results found.  Assessment/Plan: Day of Surgery, Procedure(s) (LRB): ROBOTIC ASSISTED LAPAROSCOPIC RADICAL PROSTATECTOMY LEVEL 2 (N/A) LYMPHADENECTOMY (Bilateral)  Doing well.  Continue to monitor Ambulate, Incentive spirometry DVT prophylaxis   LOS: 0 days   YARBROUGH,Mckinley Adelstein G. 08/11/2012, 1:20 PM

## 2012-08-11 NOTE — Care Management Note (Signed)
    Page 1 of 1   08/12/2012     9:55:46 AM   CARE MANAGEMENT NOTE 08/12/2012  Patient:  Wesley Stanton, Wesley Stanton   Account Number:  [id number]  Date Initiated:  08/11/2012  Documentation initiated by:  Lanier Clam  Subjective/Objective Assessment:   ADMITTED W/PROSTATE CA.     Action/Plan:   FROM HOME.HAS PCP,PHARMACY.   Anticipated DC Date:  08/12/2012   Anticipated DC Plan:  HOME/SELF CARE      DC Planning Services  CM consult      Choice offered to / List presented to:             Status of service:  Completed, signed off Medicare Important Message given?   (If response is "NO", the following Medicare IM given date fields will be blank) Date Medicare IM given:   Date Additional Medicare IM given:    Discharge Disposition:  HOME/SELF CARE  Per UR Regulation:  Reviewed for med. necessity/level of care/duration of stay  If discussed at Long Length of Stay Meetings, dates discussed:    Comments:  08/12/12 Tomeika Weinmann RN,BSN NCM [phone] D/C HOME.NO ANTICIPATED NEEDS.  08/11/12 Twylia Oka RN,BSN NCM [phone] S/P ROBOTIC  ASST LAP RADICAL PROSTATECTOMY.

## 2012-08-11 NOTE — Preoperative (Signed)
Beta Blockers   Reason not to administer Beta Blockers:Not Applicable 

## 2012-08-11 NOTE — Anesthesia Postprocedure Evaluation (Signed)
  Anesthesia Post-op Note  Patient: Wesley Stanton  Procedure(s) Performed: Procedure(s) (LRB): ROBOTIC ASSISTED LAPAROSCOPIC RADICAL PROSTATECTOMY LEVEL 2 (N/A) LYMPHADENECTOMY (Bilateral)  Patient Location: PACU  Anesthesia Type: General  Level of Consciousness: awake and alert   Airway and Oxygen Therapy: Patient Spontanous Breathing  Post-op Pain: mild  Post-op Assessment: Post-op Vital signs reviewed, Patient's Cardiovascular Status Stable, Respiratory Function Stable, Patent Airway and No signs of Nausea or vomiting  Last Vitals:  Filed Vitals:   08/11/12 0945  BP: 147/58  Pulse: 96  Temp:   Resp: 12    Post-op Vital Signs: stable   Complications: No apparent anesthesia complications

## 2012-08-12 ENCOUNTER — Encounter (HOSPITAL_COMMUNITY): Payer: Self-pay | Admitting: Urology

## 2012-08-12 LAB — HEMOGLOBIN AND HEMATOCRIT, BLOOD
HCT: 34.8 % — ABNORMAL LOW (ref 39.0–52.0)
Hemoglobin: 11.9 g/dL — ABNORMAL LOW (ref 13.0–17.0)

## 2012-08-12 MED ORDER — BISACODYL 10 MG RE SUPP
10.0000 mg | Freq: Once | RECTAL | Status: AC
  Administered 2012-08-12: 10 mg via RECTAL
  Filled 2012-08-12: qty 1

## 2012-08-12 MED ORDER — HYDROCODONE-ACETAMINOPHEN 5-325 MG PO TABS: 1.0000 | ORAL_TABLET | Freq: Four times a day (QID) | ORAL | Status: DC | PRN

## 2012-08-12 NOTE — Discharge Summary (Signed)
  Date of admission: 08/11/2012  Date of discharge: 08/12/2012  Admission diagnosis: Prostate Cancer  Discharge diagnosis: Prostate Cancer  History and Physical: For full details, please see admission history and physical. Briefly, Wesley Stanton is a 72 y.o. gentleman with localized prostate cancer.  After discussing management/treatment options, he elected to proceed with surgical treatment.  Hospital Course: JOSHOA SHAWLER was taken to the operating room on 08/11/2012 and underwent a robotic assisted laparoscopic radical prostatectomy. He tolerated this procedure well and without complications. Postoperatively, he was able to be transferred to a regular hospital room following recovery from anesthesia.  He was able to begin ambulating the night of surgery. He remained hemodynamically stable overnight.  He had excellent urine output with appropriately minimal output from his pelvic drain and his pelvic drain was removed on POD #1.  He was transitioned to oral pain medication, tolerated a clear liquid diet, and had met all discharge criteria and was able to be discharged home later on POD#1.  Laboratory values:  Basename 08/12/12 0425 08/11/12 1030  HGB 11.9* 12.9*  HCT 34.8* 35.9*    Disposition: Home  Discharge instruction: He was instructed to be ambulatory but to refrain from heavy lifting, strenuous activity, or driving. He was instructed on urethral catheter care.  Discharge medications:     Medication List     As of 08/12/2012 12:55 PM    START taking these medications         ciprofloxacin 500 MG tablet   Commonly known as: CIPRO   Take 1 tablet (500 mg total) by mouth 2 (two) times daily. Start day prior to office visit for foley removal      HYDROcodone-acetaminophen 5-325 MG per tablet   Commonly known as: NORCO/VICODIN   Take 1-2 tablets by mouth every 6 (six) hours as needed for pain.      CONTINUE taking these medications         lisinopril 10 MG tablet   Commonly known as: PRINIVIL,ZESTRIL      simvastatin 40 MG tablet   Commonly known as: ZOCOR      STOP taking these medications         aspirin 81 MG tablet      doxazosin 8 MG tablet   Commonly known as: CARDURA          Where to get your medications    These are the prescriptions that you need to pick up.   You may get these medications from any pharmacy.         ciprofloxacin 500 MG tablet   HYDROcodone-acetaminophen 5-325 MG per tablet            Followup: He will followup in 1 week for catheter removal and to discuss his surgical pathology results.

## 2012-08-12 NOTE — Progress Notes (Signed)
Patient ID: Wesley Stanton, male   DOB: 1941-02-14, 72 y.o.   MRN: [id number]  1 Day Post-Op Subjective: The patient is doing well.  No nausea or vomiting. Pain is adequately controlled.  Objective: Vital signs in last 24 hours: Temp:  [97.2 F (36.2 C)-98.3 F (36.8 C)] 98.2 F (36.8 C) (01/03 0529) Pulse Rate:  [75-96] 75  (01/03 0529) Resp:  [10-18] 18  (01/03 0529) BP: (100-150)/(47-71) 116/63 mmHg (01/03 0529) SpO2:  [99 %-100 %] 99 % (01/03 0529) Weight:  [71.124 kg (156 lb 12.8 oz)] 71.124 kg (156 lb 12.8 oz) (01/02 1100)  Intake/Output from previous day: 01/02 0701 - 01/03 0700 In: 5637.5 [P.O.:600; I.V.:3987.5; IV Piggyback:1050] Out: 3165 [Urine:2775; Drains:340; Blood:50] Intake/Output this shift: Total I/O In: 1910 [P.O.:240; I.V.:1670] Out: 2620 [Urine:2550; Drains:70]  Physical Exam:  General: Alert and oriented. CV: RRR Lungs: Clear bilaterally. GI: Soft, Nondistended. Incisions: Dressings intact. Urine: Clear Extremities: Nontender, no erythema, no edema.  Lab Results:  Basename 08/12/12 0425 08/11/12 1030  HGB 11.9* 12.9*  HCT 34.8* 35.9*      Assessment/Plan: POD# 1 s/p robotic prostatectomy.  1) SL IVF 2) Ambulate, Incentive spirometry 3) Transition to oral pain medication 4) Dulcolax suppository 5) D/C pelvic drain 6) Plan for likely discharge later today   Moody Bruins. MD   LOS: 1 day   Maisey Deandrade,LES 08/12/2012, 6:47 AM

## 2012-08-17 ENCOUNTER — Encounter (HOSPITAL_COMMUNITY): Payer: Self-pay | Admitting: *Deleted

## 2012-08-17 ENCOUNTER — Emergency Department (HOSPITAL_COMMUNITY)
Admission: EM | Admit: 2012-08-17 | Discharge: 2012-08-17 | Disposition: A | Discharge status: Home / Self Care | Payer: Medicare Other | Attending: Emergency Medicine | Admitting: Emergency Medicine

## 2012-08-17 DIAGNOSIS — E7800 Pure hypercholesterolemia, unspecified: Secondary | ICD-10-CM | POA: Insufficient documentation

## 2012-08-17 DIAGNOSIS — Z79899 Other long term (current) drug therapy: Secondary | ICD-10-CM | POA: Insufficient documentation

## 2012-08-17 DIAGNOSIS — R339 Retention of urine, unspecified: Secondary | ICD-10-CM

## 2012-08-17 DIAGNOSIS — Z87448 Personal history of other diseases of urinary system: Secondary | ICD-10-CM | POA: Insufficient documentation

## 2012-08-17 DIAGNOSIS — Z87891 Personal history of nicotine dependence: Secondary | ICD-10-CM | POA: Insufficient documentation

## 2012-08-17 DIAGNOSIS — I10 Essential (primary) hypertension: Secondary | ICD-10-CM | POA: Insufficient documentation

## 2012-08-17 DIAGNOSIS — Z8546 Personal history of malignant neoplasm of prostate: Secondary | ICD-10-CM | POA: Insufficient documentation

## 2012-08-17 DIAGNOSIS — Z8719 Personal history of other diseases of the digestive system: Secondary | ICD-10-CM | POA: Insufficient documentation

## 2012-08-17 LAB — CBC
HCT: 35.8 % — ABNORMAL LOW (ref 39.0–52.0)
Hemoglobin: 12.7 g/dL — ABNORMAL LOW (ref 13.0–17.0)
MCH: 32.5 pg (ref 26.0–34.0)
MCHC: 35.5 g/dL (ref 30.0–36.0)
MCV: 91.6 fL (ref 78.0–100.0)
Platelets: 199 10*3/uL (ref 150–400)
RBC: 3.91 MIL/uL — ABNORMAL LOW (ref 4.22–5.81)
RDW: 11.9 % (ref 11.5–15.5)
WBC: 7.3 10*3/uL (ref 4.0–10.5)

## 2012-08-17 LAB — BASIC METABOLIC PANEL
BUN: 12 mg/dL (ref 6–23)
CO2: 23 mEq/L (ref 19–32)
Calcium: 9.1 mg/dL (ref 8.4–10.5)
Chloride: 98 mEq/L (ref 96–112)
Creatinine, Ser: 0.81 mg/dL (ref 0.50–1.35)
GFR calc Af Amer: >90 mL/min (ref >90)
GFR calc non Af Amer: 86 mL/min — ABNORMAL LOW (ref >90)
Glucose, Bld: 102 mg/dL — ABNORMAL HIGH (ref 70–99)
Potassium: 4 mEq/L (ref 3.5–5.1)
Sodium: 133 mEq/L — ABNORMAL LOW (ref 135–145)

## 2012-08-17 LAB — URINE MICROSCOPIC-ADD ON

## 2012-08-17 LAB — URINALYSIS, ROUTINE W REFLEX MICROSCOPIC
Bilirubin Urine: NEGATIVE
Glucose, UA: NEGATIVE mg/dL
Ketones, ur: NEGATIVE mg/dL
Nitrite: NEGATIVE
Protein, ur: NEGATIVE mg/dL
Specific Gravity, Urine: 1.012 (ref 1.005–1.030)
Urobilinogen, UA: 1 mg/dL (ref 0.0–1.0)
pH: 6 (ref 5.0–8.0)

## 2012-08-17 NOTE — ED Provider Notes (Deleted)
MSE was initiated and I personally evaluated the patient and placed orders including cbc, bmet, ua at  6:51 PM on August 17, 2012.  72 year old male presenting to the emergency department with urinary retention after having his catheter removed today. Patient had prostatectomy a week ago by Dr. Laverle Patter. He saw Dr. Laverle Patter today for his catheter to be removed. He was told he should be able to urinate on his own today. Patient drank some fluids, however he was unable to urinate thereafter. Dr. Laverle Patter advised to go to the ER if he was unable to urinate or have any pain. Upon arrival to the emergency department patient was catheterized and is now feeling better. Patient concerned if he goes home and drinks fluids he may not be able to urinate and a half to return to the emergency department. He will see Dr. Laverle Patter again tomorrow the office. Currently denies any pain or any other symptoms. No fever or chills. On exam patient is well-developed, well-nourished and appears in no apparent distress. No suprapubic tenderness. Catheter is in place. No evidence of surrounding infection.   The patient appears stable so that the remainder of the MSE may be completed by another provider.  Trevor Mace, PA-C 08/17/12 [phone]

## 2012-08-17 NOTE — ED Provider Notes (Signed)
History     CSN: [id number]  Arrival date & time 08/17/12  4098   First MD Initiated Contact with Patient 08/17/12 1850      Chief Complaint  Patient presents with  . Urinary Retention    (Consider location/radiation/quality/duration/timing/severity/associated sxs/prior treatment) HPI Comments: 72 year old male presenting to the emergency department with urinary retention after having his catheter removed today. Patient had prostatectomy a week ago by Dr. Laverle Patter. He saw Dr. Laverle Patter today for his catheter to be removed. He was told he should be able to urinate on his own today. Patient drank some fluids, however he was unable to urinate thereafter. Dr. Laverle Patter advised to go to the ER if he was unable to urinate or have any pain. Upon arrival to the emergency department patient was catheterized and is now feeling better. Patient concerned if he goes home and drinks fluids he may not be able to urinate and a half to return to the emergency department. He will see Dr. Laverle Patter again tomorrow the office. Currently denies any pain or any other symptoms. No fever or chills.  The history is provided by the patient.    Past Medical History  Diagnosis Date  . Prostate cancer 06/28/12    bx=Adenocarcinoma,PSA=2.94,gleason=3+4=7 & 4+3=7,volume=60.4cc  . ED (erectile dysfunction)   . Hypertension   . Hypercholesterolemia   . BPH (benign prostatic hyperplasia)   . Urinary frequency     AND NOCTURIA  . Diverticulitis     OCTOBER 2013  . Deviated septum     PT STATES TOLD HE SNORES-TOLD HE HAS DEVIATED SEPTUM    Past Surgical History  Procedure Date  . Prostate biopsy 06/28/12    Adenocarcinoma  . Tonsillectomy     as a child  . Variococele repair     [id number] repair  . Wisdom teeth extracted 1968     AND ANOTHER WISDOM TOOTH REMOVED 2011  . Robot assisted laparoscopic radical prostatectomy 08/11/2012    Procedure: ROBOTIC ASSISTED LAPAROSCOPIC RADICAL PROSTATECTOMY LEVEL 2;  Surgeon: Crecencio Mc, MD;  Location: [facility];  Service: Urology;  Laterality: N/A;     . Lymphadenectomy 08/11/2012    Procedure: LYMPHADENECTOMY;  Surgeon: Crecencio Mc, MD;  Location: [facility];  Service: Urology;  Laterality: Bilateral;    History reviewed. No pertinent family history.  History  Substance Use Topics  . Smoking status: Former Smoker -- 0.5 packs/day for 20 years    Types: Cigarettes  . Smokeless tobacco: Not on file     Comment: quit smoking 30 years ago  . Alcohol Use: Yes     Comment: wine-COUPLE GLASSES A DAY      Review of Systems  Constitutional: Negative for fever and chills.  Genitourinary: Positive for difficulty urinating.  All other systems reviewed and are negative.    Allergies  Sulfa antibiotics  Home Medications   Current Outpatient Rx  Name  Route  Sig  Dispense  Refill  . CIPROFLOXACIN HCL 500 MG PO TABS   Oral   Take 500 mg by mouth 2 (two) times daily. Start day prior to office visit for foley removal         . LISINOPRIL 10 MG PO TABS   Oral   Take 10 mg by mouth every morning.          Marland Kitchen SIMVASTATIN 40 MG PO TABS   Oral   Take 40 mg by mouth at bedtime.  BP 160/76  Pulse 82  Temp 98.3 F (36.8 C) (Oral)  Resp 16  SpO2 100%  Physical Exam  Nursing note and vitals reviewed. Constitutional: He is oriented to person, place, and time. He appears well-developed and well-nourished. No distress.  HENT:  Head: Normocephalic and atraumatic.  Mouth/Throat: Oropharynx is clear and moist.  Eyes: Conjunctivae normal are normal.  Neck: Normal range of motion. Neck supple.  Cardiovascular: Normal rate, regular rhythm and normal heart sounds.   Pulmonary/Chest: Effort normal and breath sounds normal.  Abdominal: Soft. Bowel sounds are normal. He exhibits no distension. There is no tenderness.       Well healing surgical scars present on abdomen. No surrounding erythema or edema. Non tender.  Genitourinary: No penile tenderness.        Catheter in place. No surrounding erythema or edema.  Musculoskeletal: Normal range of motion. He exhibits no edema.  Neurological: He is alert and oriented to person, place, and time.  Skin: Skin is warm and dry.  Psychiatric: He has a normal mood and affect. His behavior is normal.    ED Course  Procedures (including critical care time)  Labs Reviewed  URINALYSIS, ROUTINE W REFLEX MICROSCOPIC - Abnormal; Notable for the following:    Hgb urine dipstick MODERATE (*)     Leukocytes, UA TRACE (*)     All other components within normal limits  CBC - Abnormal; Notable for the following:    RBC 3.91 (*)     Hemoglobin 12.7 (*)     HCT 35.8 (*)     All other components within normal limits  BASIC METABOLIC PANEL - Abnormal; Notable for the following:    Sodium 133 (*)     Glucose, Bld 102 (*)     GFR calc non Af Amer 86 (*)     All other components within normal limits  URINE MICROSCOPIC-ADD ON - Abnormal; Notable for the following:    Squamous Epithelial / LPF FEW (*)     Bacteria, UA FEW (*)     All other components within normal limits   No results found.   1. Urinary retention       MDM  72 year old male with urinary retention after having catheter removed 1 week after surgery. He spoke with Dr. Laverle Patter who advised him to come to the emergency department. Urine present when he was recatheterized. No evidence of infection. Kidney functions normal. He will followup with Dr. Laverle Patter tomorrow. Case has been discussed with Dr. Lynelle Doctor who agrees with plan of care. Patient is stable for discharge.       Trevor Mace, PA-C 08/17/12 2026

## 2012-08-17 NOTE — ED Provider Notes (Signed)
Medical screening examination/treatment/procedure(s) were performed by non-physician practitioner and as supervising physician I was immediately available for consultation/collaboration.   Celene Kras, MD 08/17/12 2033

## 2012-08-17 NOTE — ED Notes (Signed)
Pt reports having prostatectomy recently and had foley catheter removed today. Reports has not urinated since 1230pm today. Reports pain and pressure in bladder.

## 2012-08-20 ENCOUNTER — Encounter (HOSPITAL_COMMUNITY): Payer: Self-pay | Admitting: Emergency Medicine

## 2012-08-20 ENCOUNTER — Emergency Department (HOSPITAL_COMMUNITY)
Admission: EM | Admit: 2012-08-20 | Discharge: 2012-08-20 | Disposition: A | Discharge status: Home / Self Care | Payer: Medicare Other | Attending: Emergency Medicine | Admitting: Emergency Medicine

## 2012-08-20 DIAGNOSIS — Z79899 Other long term (current) drug therapy: Secondary | ICD-10-CM | POA: Insufficient documentation

## 2012-08-20 DIAGNOSIS — Z87891 Personal history of nicotine dependence: Secondary | ICD-10-CM | POA: Insufficient documentation

## 2012-08-20 DIAGNOSIS — R339 Retention of urine, unspecified: Secondary | ICD-10-CM | POA: Insufficient documentation

## 2012-08-20 DIAGNOSIS — Z87448 Personal history of other diseases of urinary system: Secondary | ICD-10-CM | POA: Insufficient documentation

## 2012-08-20 DIAGNOSIS — Z8546 Personal history of malignant neoplasm of prostate: Secondary | ICD-10-CM | POA: Insufficient documentation

## 2012-08-20 DIAGNOSIS — I10 Essential (primary) hypertension: Secondary | ICD-10-CM | POA: Insufficient documentation

## 2012-08-20 DIAGNOSIS — Z8719 Personal history of other diseases of the digestive system: Secondary | ICD-10-CM | POA: Insufficient documentation

## 2012-08-20 LAB — URINALYSIS, MICROSCOPIC ONLY
Bilirubin Urine: NEGATIVE
Glucose, UA: NEGATIVE mg/dL
Nitrite: NEGATIVE
Protein, ur: NEGATIVE mg/dL
Specific Gravity, Urine: 1.013 (ref 1.005–1.030)
Urobilinogen, UA: 0.2 mg/dL (ref 0.0–1.0)
pH: 6 (ref 5.0–8.0)

## 2012-08-20 LAB — POCT I-STAT, CHEM 8
BUN: 9 mg/dL (ref 6–23)
Calcium, Ion: 1.21 mmol/L (ref 1.13–1.30)
Chloride: 103 mEq/L (ref 96–112)
Creatinine, Ser: 0.9 mg/dL (ref 0.50–1.35)
Glucose, Bld: 101 mg/dL — ABNORMAL HIGH (ref 70–99)
HCT: 39 % (ref 39.0–52.0)
Hemoglobin: 13.3 g/dL (ref 13.0–17.0)
Potassium: 3.9 mEq/L (ref 3.5–5.1)
Sodium: 138 mEq/L (ref 135–145)
TCO2: 26 mmol/L (ref 0–100)

## 2012-08-20 NOTE — ED Notes (Addendum)
Pt from home and c/o urinary retention.  Pt was here for same two days ago, went to [facility] yesterday and had foley irrigated and then had foley removed. Pt reports was urinating fine yesterday, but now cannot urinate at all. Pt reports last normal urination around midnight. Pt had prostatectomy on 12/2, Dr. Laverle Patter. Pt in NAD and A&O

## 2012-08-20 NOTE — ED Provider Notes (Signed)
History     CSN: [id number]  Arrival date & time 08/20/12  7829   First MD Initiated Contact with Patient 08/20/12 [phone]      Chief Complaint  Patient presents with  . Urinary Retention    (Consider location/radiation/quality/duration/timing/severity/associated sxs/prior treatment) HPI Pt presents with c/o urinary retention.  Pt states he had a prostatectomy on 1/2- had urinary retention on 1/8 and was seen at [facility]- had a foley placed at that time- then removed at urologist yesterday.  He was able to urinate normally last night until this morning has not been able to urinate.  Is starting to feel some lower abdominal pain.  No fever/chills, no vomiting.  There are no other associated systemic symptoms, there are no other alleviating or modifying factors.   Past Medical History  Diagnosis Date  . Prostate cancer 06/28/12    bx=Adenocarcinoma,PSA=2.94,gleason=3+4=7 & 4+3=7,volume=60.4cc  . ED (erectile dysfunction)   . Hypertension   . Hypercholesterolemia   . BPH (benign prostatic hyperplasia)   . Urinary frequency     AND NOCTURIA  . Diverticulitis     OCTOBER 2013  . Deviated septum     PT STATES TOLD HE SNORES-TOLD HE HAS DEVIATED SEPTUM    Past Surgical History  Procedure Date  . Prostate biopsy 06/28/12    Adenocarcinoma  . Tonsillectomy     as a child  . Variococele repair     [id number] repair  . Wisdom teeth extracted 1968     AND ANOTHER WISDOM TOOTH REMOVED 2011  . Robot assisted laparoscopic radical prostatectomy 08/11/2012    Procedure: ROBOTIC ASSISTED LAPAROSCOPIC RADICAL PROSTATECTOMY LEVEL 2;  Surgeon: Crecencio Mc, MD;  Location: [facility];  Service: Urology;  Laterality: N/A;     . Lymphadenectomy 08/11/2012    Procedure: LYMPHADENECTOMY;  Surgeon: Crecencio Mc, MD;  Location: [facility];  Service: Urology;  Laterality: Bilateral;    No family history on file.  History  Substance Use Topics  . Smoking status: Former Smoker -- 0.5 packs/day for 20 years   Types: Cigarettes  . Smokeless tobacco: Not on file     Comment: quit smoking 30 years ago  . Alcohol Use: Yes     Comment: wine-COUPLE GLASSES A DAY      Review of Systems ROS reviewed and all otherwise negative except for mentioned in HPI  Allergies  Sulfa antibiotics  Home Medications   Current Outpatient Rx  Name  Route  Sig  Dispense  Refill  . CIPROFLOXACIN HCL 500 MG PO TABS   Oral   Take 500 mg by mouth 2 (two) times daily. Start day prior to office visit for foley removal         . LISINOPRIL 10 MG PO TABS   Oral   Take 10 mg by mouth every morning.          Marland Kitchen SIMVASTATIN 40 MG PO TABS   Oral   Take 40 mg by mouth at bedtime.            BP 139/82  Pulse 95  Temp 98.2 F (36.8 C) (Oral)  Resp 16  SpO2 100% Vitals reviewed Physical Exam Physical Examination: General appearance - alert, well appearing, and in no distress Mental status - alert, oriented to person, place, and time Eyes - no scleral icterus, no conjunctival injection Mouth - mucous membranes moist, pharynx normal without lesions Chest - clear to auscultation, no wheezes, rales or rhonchi, symmetric air entry Heart - normal  rate, regular rhythm, normal S1, S2, no murmurs, rubs, clicks or gallops Abdomen - soft, nontender, nondistended, no masses or organomegaly GU Male - no penile lesions or discharge, no testicular masses or tenderness, no hernias Extremities - peripheral pulses normal, no pedal edema, no clubbing or cyanosis Skin - normal coloration and turgor, no rashes  ED Course  Procedures (including critical care time)  Labs Reviewed  URINALYSIS, MICROSCOPIC ONLY - Abnormal; Notable for the following:    Hgb urine dipstick LARGE (*)     Ketones, ur TRACE (*)     Leukocytes, UA TRACE (*)     All other components within normal limits  POCT I-STAT, CHEM 8 - Abnormal; Notable for the following:    Glucose, Bld 101 (*)     All other components within normal limits  URINE  CULTURE   No results found.   1. Urinary retention       MDM  Pt presenting with urinary retention.  He had recent prostatectomy and has had urinary retention with foley catheter last week- this resolved and catheter removed yesterday- then recurred this morning.  Foley replaced, kidney function normal.  Pt to f/u with Dr. Laverle Patter, urology.  Discharged with strict return precautions.  Pt agreeable with plan.        Ethelda Chick, MD 08/20/12 1047

## 2012-08-21 LAB — URINE CULTURE
Colony Count: NO GROWTH
Culture: NO GROWTH

## 2016-08-18 ENCOUNTER — Other Ambulatory Visit: Payer: Self-pay | Admitting: Internal Medicine

## 2016-08-18 DIAGNOSIS — H538 Other visual disturbances: Secondary | ICD-10-CM

## 2016-08-24 ENCOUNTER — Ambulatory Visit
Admission: RE | Admit: 2016-08-24 | Discharge: 2016-08-24 | Disposition: A | Discharge status: Home / Self Care | Payer: Medicare Other | Source: Ambulatory Visit | Attending: Internal Medicine | Admitting: Internal Medicine

## 2016-08-24 DIAGNOSIS — H538 Other visual disturbances: Secondary | ICD-10-CM

## 2017-09-30 DIAGNOSIS — J342 Deviated nasal septum: Secondary | ICD-10-CM | POA: Insufficient documentation

## 2017-09-30 DIAGNOSIS — J302 Other seasonal allergic rhinitis: Secondary | ICD-10-CM | POA: Insufficient documentation

## 2017-09-30 DIAGNOSIS — J019 Acute sinusitis, unspecified: Secondary | ICD-10-CM | POA: Insufficient documentation

## 2019-02-17 ENCOUNTER — Other Ambulatory Visit: Payer: Self-pay | Admitting: Internal Medicine

## 2019-02-17 DIAGNOSIS — I779 Disorder of arteries and arterioles, unspecified: Secondary | ICD-10-CM

## 2019-02-23 ENCOUNTER — Ambulatory Visit
Admission: RE | Admit: 2019-02-23 | Discharge: 2019-02-23 | Disposition: A | Discharge status: Home / Self Care | Payer: Medicare Other | Source: Ambulatory Visit | Attending: Internal Medicine | Admitting: Internal Medicine

## 2019-02-23 DIAGNOSIS — I779 Disorder of arteries and arterioles, unspecified: Secondary | ICD-10-CM

## 2019-04-05 HISTORY — PX: SKIN SURGERY: SHX2413

## 2019-06-12 ENCOUNTER — Other Ambulatory Visit: Payer: Self-pay | Admitting: Internal Medicine

## 2019-06-12 DIAGNOSIS — R1031 Right lower quadrant pain: Secondary | ICD-10-CM

## 2019-06-19 ENCOUNTER — Ambulatory Visit
Admission: RE | Admit: 2019-06-19 | Discharge: 2019-06-19 | Disposition: A | Discharge status: Home / Self Care | Payer: Medicare Other | Source: Ambulatory Visit | Attending: Internal Medicine | Admitting: Internal Medicine

## 2019-06-19 ENCOUNTER — Other Ambulatory Visit: Payer: Self-pay

## 2019-06-19 DIAGNOSIS — R1031 Right lower quadrant pain: Secondary | ICD-10-CM

## 2019-06-19 MED ORDER — IOPAMIDOL (ISOVUE-300) INJECTION 61%
100.0000 mL | Freq: Once | INTRAVENOUS | Status: AC | PRN
Start: 2019-06-19 — End: 2019-06-19
  Administered 2019-06-19: 11:00:00 100 mL via INTRAVENOUS

## 2019-07-03 ENCOUNTER — Other Ambulatory Visit: Payer: Self-pay

## 2019-07-03 DIAGNOSIS — Z20822 Contact with and (suspected) exposure to covid-19: Secondary | ICD-10-CM

## 2019-07-05 LAB — NOVEL CORONAVIRUS, NAA: SARS-CoV-2, NAA: NOT DETECTED

## 2019-08-30 ENCOUNTER — Ambulatory Visit: Payer: Medicare Other | Attending: Internal Medicine

## 2019-08-30 DIAGNOSIS — Z23 Encounter for immunization: Secondary | ICD-10-CM | POA: Insufficient documentation

## 2019-08-30 NOTE — Progress Notes (Signed)
   Covid-19 Vaccination Clinic  Name:  Wesley Stanton    MRN: [id number] DOB: 1941/03/16  08/30/2019  [name] was observed post Covid-19 immunization for 15 minutes without incidence. He was provided with Vaccine Information Sheet and instruction to access the V-Safe system.   [name] was instructed to call 911 with any severe reactions post vaccine: Marland Kitchen Difficulty breathing  . Swelling of your face and throat  . A fast heartbeat  . A bad rash all over your body  . Dizziness and weakness    Immunizations Administered    Name Date Dose VIS Date Route   Pfizer COVID-19 Vaccine 08/30/2019 11:00 AM 0.3 mL 07/21/2019 Intramuscular   Manufacturer: [place]   Lot: [id number]   [place]: [id number]

## 2019-09-20 ENCOUNTER — Ambulatory Visit: Payer: Medicare Other | Attending: Internal Medicine

## 2019-09-20 DIAGNOSIS — Z23 Encounter for immunization: Secondary | ICD-10-CM | POA: Insufficient documentation

## 2019-09-20 NOTE — Progress Notes (Signed)
   Covid-19 Vaccination Clinic  Name:  Wesley Stanton    MRN: [id number] DOB: 1941/08/02  09/20/2019  [name] was observed post Covid-19 immunization for 15 minutes without incidence. He was provided with Vaccine Information Sheet and instruction to access the V-Safe system.   [name] was instructed to call 911 with any severe reactions post vaccine: Marland Kitchen Difficulty breathing  . Swelling of your face and throat  . A fast heartbeat  . A bad rash all over your body  . Dizziness and weakness    Immunizations Administered    Name Date Dose VIS Date Route   Pfizer COVID-19 Vaccine 09/20/2019  3:19 PM 0.3 mL 07/21/2019 Intramuscular   Manufacturer: [place]   Lot: [id number]   [place]: [id number]

## 2020-04-01 ENCOUNTER — Encounter: Payer: Self-pay | Admitting: Medical Oncology

## 2020-04-01 NOTE — Progress Notes (Signed)
Left message to introduce myself as the prostate nurse navigator and asked for a return call to discuss referral to the [facility], 8/31.

## 2020-04-01 NOTE — Progress Notes (Signed)
I called pt to introduce myself as the Prostate Nurse Navigator and the Coordinator of the Prostate [place].  1. I confirmed with the patient he is aware of his referral to the clinic 8/31, arriving at 12:30 pm.  2. I discussed the format of the clinic and the physicians he will be seeing that day.   3. I discussed where the clinic is located and how to contact me. I reviewed [place] parking, registration and COVID protocol.   4. I confirmed his address and informed him I would be mailing a packet of information and forms to be completed. I asked him to bring them with him the day of his appointment.   He voiced understanding of the above. I asked him to call me if he has any questions or concerns regarding his appointments or the forms he needs to complete.

## 2020-04-05 ENCOUNTER — Encounter: Payer: Self-pay | Admitting: Medical Oncology

## 2020-04-05 ENCOUNTER — Telehealth: Payer: Self-pay | Admitting: Radiation Oncology

## 2020-04-05 NOTE — Progress Notes (Signed)
Patient called asking if he can bring his significant other to [facility]. I told him as of today, he can but rules could change due to COVID. He has been out of state and did not know if this would impact him. I told him as long as he has no symptoms and he passes the COVID screening when he arrives here at [facility], he should be fine. I reviewed [place] parking, registration and COVD protocol for [facility]. He voiced understanding of the above.

## 2020-04-05 NOTE — Telephone Encounter (Signed)
Received voicemail message from patient requesting to speak with Cira Rue, prostate navigator. Patient explains in his voicemail that he has received his packet for [facility]. He wishes to discuss "one of his girlfriends" and a "recent trip out [place]." Patient reports on the voicemail he is vaccinated again covid. Will relay message to [place].

## 2020-04-09 ENCOUNTER — Ambulatory Visit
Admission: RE | Admit: 2020-04-09 | Discharge: 2020-04-09 | Disposition: A | Discharge status: Home / Self Care | Payer: Medicare Other | Source: Ambulatory Visit | Attending: Radiation Oncology | Admitting: Radiation Oncology

## 2020-04-09 ENCOUNTER — Encounter: Payer: Self-pay | Admitting: General Practice

## 2020-04-09 ENCOUNTER — Encounter: Payer: Self-pay | Admitting: Radiation Oncology

## 2020-04-09 ENCOUNTER — Encounter: Payer: Self-pay | Admitting: Medical Oncology

## 2020-04-09 ENCOUNTER — Inpatient Hospital Stay: Payer: Medicare Other | Attending: Oncology | Admitting: Oncology

## 2020-04-09 ENCOUNTER — Other Ambulatory Visit: Payer: Self-pay

## 2020-04-09 ENCOUNTER — Encounter: Payer: Self-pay | Admitting: Urology

## 2020-04-09 VITALS — BP 137/71 | HR 78 | Temp 98.3°F | Resp 18 | Ht 70.0 in | Wt 151.8 lb

## 2020-04-09 DIAGNOSIS — Z87891 Personal history of nicotine dependence: Secondary | ICD-10-CM | POA: Insufficient documentation

## 2020-04-09 DIAGNOSIS — N529 Male erectile dysfunction, unspecified: Secondary | ICD-10-CM | POA: Diagnosis not present

## 2020-04-09 DIAGNOSIS — E785 Hyperlipidemia, unspecified: Secondary | ICD-10-CM | POA: Diagnosis not present

## 2020-04-09 DIAGNOSIS — I10 Essential (primary) hypertension: Secondary | ICD-10-CM

## 2020-04-09 DIAGNOSIS — Z8042 Family history of malignant neoplasm of prostate: Secondary | ICD-10-CM | POA: Insufficient documentation

## 2020-04-09 DIAGNOSIS — E7800 Pure hypercholesterolemia, unspecified: Secondary | ICD-10-CM | POA: Insufficient documentation

## 2020-04-09 DIAGNOSIS — C61 Malignant neoplasm of prostate: Secondary | ICD-10-CM | POA: Insufficient documentation

## 2020-04-09 DIAGNOSIS — H409 Unspecified glaucoma: Secondary | ICD-10-CM | POA: Insufficient documentation

## 2020-04-09 DIAGNOSIS — Z808 Family history of malignant neoplasm of other organs or systems: Secondary | ICD-10-CM

## 2020-04-09 HISTORY — DX: Atherosclerosis of aorta: I70.0

## 2020-04-09 HISTORY — DX: Disorder of arteries and arterioles, unspecified: I77.9

## 2020-04-09 NOTE — Progress Notes (Signed)
[place] Psychosocial Distress Screening Spiritual Care  Met with Dub Mikes and Bryson Ha in [facility] to introduce [facility] team/resources, reviewing distress screen per protocol.  The patient scored a 1 on the Psychosocial Distress Thermometer which indicates mild distress. Also assessed for distress and other psychosocial needs.   ONCBCN DISTRESS SCREENING 04/09/2020  Screening Type Initial Screening  Distress experienced in past week (1-10) 1  Emotional problem type Adjusting to illness  Referral to support programs Yes   Mr Mcphillips reports little distress and good support, including from [employer]. Provided empathic listening and normalization of feelings.  Follow up needed: No. Per Mr Man, no other needs or concerns at this time, but he knows to contact Team if needs or concerns arise.   [place], [place], [facility] Pager [phone] Voicemail [phone]

## 2020-04-09 NOTE — Progress Notes (Signed)
Radiation Oncology         [phone] ________________________________  Multidisciplinary Prostate Cancer Clinic  Initial Radiation Oncology Consultation  Name: Wesley Stanton MRN: [id number]  Date: 04/09/2020  DOB: 02-Jun-1941  AO:[id number], [place] Ar, MD  Raynelle Bring, MD   REFERRING PHYSICIAN: Raynelle Bring, MD  DIAGNOSIS: 79 y.o. gentleman with a biochemical recurrence of prostate cancer with slowly rising PSA s/p RALP in 08/2012 for stage pT3a, pN0, Gleason 3+4 prostate cancer.    ICD-10-CM   1. Prostate cancer ([place])  C61     HISTORY OF PRESENT ILLNESS::Wesley Stanton is a 79 y.o. gentleman.  He was initially noted to have a prostate nodule on digital rectal exam in 2013, despite a normal PSA at 2.94. He underwent prostate biopsy on 06/28/2012 showing 2/12 positive cores with a maximum Gleason score of 4+3.     He opted to proceed with RALP which was performed on 08/11/2012 under the care of Dr. Alinda Money, with final surgical pathology demonstrating  PT3aN0, Gleason 3+4 prostatic adenocarcinoma with focal extracapsular extension but margins and seminal vesicles were not involved and 3 of 3 lymph nodes (0/3) were benign.   Left base area:   With perineural invasion and ECE:       His postoperative PSA was undetectable and remained undetectable until around 04/2016.  Since that time, the PSA has been slowly rising from 0.05 at that time up to 0.32 in 01/2020.  His most recent PSA on 03/25/2020 was relatively stable at 0.25.  The patient reviewed the PSA and pathology results with his urologist and he has kindly been referred today to the multidisciplinary prostate cancer clinic for presentation of pathology and radiology studies in our conference for discussion of potential radiation treatment options and clinical evaluation.  PREVIOUS RADIATION THERAPY: No  PAST MEDICAL HISTORY:  has a past medical history of BPH (benign prostatic hyperplasia), Deviated septum,  Diverticulitis, ED (erectile dysfunction), Hypercholesterolemia, Hypertension, Prostate cancer ([place]) (06/28/12), and Urinary frequency.    PAST SURGICAL HISTORY: Past Surgical History:  Procedure Laterality Date  . LYMPHADENECTOMY  08/11/2012   Procedure: LYMPHADENECTOMY;  Surgeon: Dutch Gray, MD;  Location: [facility];  Service: Urology;  Laterality: Bilateral;  . PROSTATE BIOPSY  06/28/12   Adenocarcinoma  . ROBOT ASSISTED LAPAROSCOPIC RADICAL PROSTATECTOMY  08/11/2012   Procedure: ROBOTIC ASSISTED LAPAROSCOPIC RADICAL PROSTATECTOMY LEVEL 2;  Surgeon: Dutch Gray, MD;  Location: [facility];  Service: Urology;  Laterality: N/A;     . TONSILLECTOMY     as a child  . VARIOCOCELE REPAIR     1970's repair  . WISDOM TEETH EXTRACTED 1968     AND ANOTHER WISDOM TOOTH REMOVED 2011    FAMILY HISTORY: family history includes Prostate cancer in his brother; Uterine cancer in his mother.  SOCIAL HISTORY:  reports that he quit smoking about 30 years ago. His smoking use included cigarettes. He has a 10.00 pack-year smoking history. He has never used smokeless tobacco. He reports current alcohol use. He reports that he does not use drugs.  ALLERGIES: Sulfa antibiotics  MEDICATIONS:  Current Outpatient Medications  Medication Sig Dispense Refill  . Zoster Vaccine Adjuvanted (SHINGRIX) injection TO BE ADMINISTERED BY PHARMACIST FOR IMMUNIZATION    . ciprofloxacin (CIPRO) 500 MG tablet Take 500 mg by mouth 2 (two) times daily. Start day prior to office visit for foley removal    . clotrimazole-betamethasone (LOTRISONE) cream Apply topically 2 (two) times daily.    Marland Kitchen latanoprost (XALATAN) 0.005 % ophthalmic solution  1 drop at bedtime.    Marland Kitchen lisinopril (PRINIVIL,ZESTRIL) 10 MG tablet Take 10 mg by mouth every morning.     . simvastatin (ZOCOR) 20 MG tablet Take 20 mg by mouth daily.     No current facility-administered medications for this encounter.    REVIEW OF SYSTEMS:  On review of systems, the patient  reports that he is doing well overall. He denies any chest pain, shortness of breath, cough, fevers, chills, night sweats, unintended weight changes. He denies any bowel disturbances, and denies abdominal pain, nausea or vomiting. He denies any new musculoskeletal or joint aches or pains. His IPSS was 5, indicating mild urinary symptoms. His SHIM was 10, indicating he does have moderate erectile dysfunction. A complete review of systems is obtained and is otherwise negative.   PHYSICAL EXAM:  Wt Readings from Last 3 Encounters:  08/11/12 156 lb 12.8 oz (71.1 kg)  08/04/12 159 lb (72.1 kg)  07/26/12 161 lb 12.8 oz (73.4 kg)   Temp Readings from Last 3 Encounters:  08/20/12 98.2 F (36.8 C) (Oral)  08/17/12 98.3 F (36.8 C) (Oral)  08/12/12 98.2 F (36.8 C) (Oral)   BP Readings from Last 3 Encounters:  08/20/12 139/82  08/17/12 160/76  08/12/12 116/63   Pulse Readings from Last 3 Encounters:  08/20/12 95  08/17/12 82  08/12/12 75    /10  In general this is a well appearing Caucasian male in no acute distress. He is alert and oriented x4 and appropriate throughout the examination. HEENT reveals that the patient is normocephalic, atraumatic. EOMs are intact. PERRLA. Skin is intact without any evidence of gross lesions.  Cardiopulmonary assessment is negative for acute distress and he exhibits normal effort. The abdomen is soft, non tender, non distended. Lower extremities are negative for pretibial pitting edema, deep calf tenderness, cyanosis or clubbing.  KPS = 100  100 - Normal; no complaints; no evidence of disease. 90   - Able to carry on normal activity; minor signs or symptoms of disease. 80   - Normal activity with effort; some signs or symptoms of disease. 12   - Cares for self; unable to carry on normal activity or to do active work. 60   - Requires occasional assistance, but is able to care for most of his personal needs. 50   - Requires considerable assistance and  frequent medical care. 88   - Disabled; requires special care and assistance. 78   - Severely disabled; hospital admission is indicated although death not imminent. 13   - Very sick; hospital admission necessary; active supportive treatment necessary. 10   - Moribund; fatal processes progressing rapidly. 0     - Dead  Karnofsky DA, Abelmann Layton, Craver LS and Burchenal [facility] [phone]) The use of the nitrogen mustards in the palliative treatment of carcinoma: with particular reference to bronchogenic carcinoma Cancer 1 634-56   LABORATORY DATA:  Lab Results  Component Value Date   WBC 7.3 08/17/2012   HGB 13.3 08/20/2012   HCT 39.0 08/20/2012   MCV 91.6 08/17/2012   PLT 199 08/17/2012   Lab Results  Component Value Date   NA 138 08/20/2012   K 3.9 08/20/2012   CL 103 08/20/2012   CO2 23 08/17/2012   No results found for: ALT, AST, GGT, ALKPHOS, BILITOT   RADIOGRAPHY: No results found.    IMPRESSION/PLAN: 79 y.o. gentleman with a biochemical recurrence of prostate cancer with slowly rising PSA s/p RALP in 08/2012 for stage pT3a,  pN0, Gleason 3+4 prostate cancer.  Today we reviewed the findings and workup thus far.  We discussed the natural history of prostate cancer.  We reviewed the the implications of positive margins, extracapsular extension, and seminal vesicle involvement on the risk of prostate cancer recurrence, particularly in light of a detectable, rising postoperative PSA. We reviewed some of the evidence suggesting an advantage for patients who undergo salvage radiotherapy in this setting in terms of disease control and overall survival. We discussed radiation treatment directed to the prostatic fossa and pelvic lymph nodes with regard to the logistics and delivery of external beam radiation treatment.  Given his excellent overall health and performance status, an aggressive, curative treatment approach is felt to be most reasonable in his case.  Therefor, consensus recommendation is  to proceed with a 7-1/2-week course of daily radiotherapy to the prostate fossa and pelvic lymph nodes. We reviewed data from the [facility] trial regarding use of ADT in combination with salvage XRT to the prostate fossa and pelvic lymph nodes in the setting of biochemical recurrence.  This study shows that extending radiation therapy to the pelvic lymph nodes combined with adding short-term hormone therapy to standard treatment of the prostate surgical bed can extend the amount of time before disease progression but does not appear to demonstrate an overall survival benefit.  Therefore, it is felt that the toxicities associated with ADT and the negative impact it has on quality of life for patient's with lower risk, Gleason 6-7 disease with positive margin/ECE and several years out from surgery, may outweigh the overall benefits of the treatment. Following a lengthy discussion of this topic, the patient prefers to avoid ADT at this time and only consider the use of ADT should his PSA continue to rise despite radiation alone.   He was encouraged to ask questions that were answered to his stated satisfaction.  He appears to have a good understanding of his disease and our treatment recommendations which are of curative intent.   At the end of the conversation the patient is interested in moving forward with salvage radiotherapy but would prefer to postpone the start of daily treatments until later in the fall, around November 2021.  We feel that this is quite reasonable and will therefore get him scheduled for CT simulation/treatment planning in late October 2021, in anticipation of beginning his daily radiation treatments in early November 2021 unless he decides otherwise and wishes to start sooner or later.  He has our contact information and knows that he is welcome to call with any question or concerns at any time.  We enjoyed meeting him today and look forward to continuing to participate in his care.      Nicholos Johns, PA-C    Tyler Pita, MD  [facility] Direct Dial: [phone]  Fax: [phone] [email or web address]  Skype  LinkedIn   This document serves as a record of services personally performed by Tyler Pita, MD and Freeman Caldron, PA-C. It was created on their behalf by Wilburn Mylar, a trained medical scribe. The creation of this record is based on the scribe's personal observations and the provider's statements to them. This document has been checked and approved by the attending provider.

## 2020-04-09 NOTE — Progress Notes (Signed)
                               Care Plan Summary  Name: Wesley Stanton  DOB: 1941-08-09  Your Medical Team:   Urologist -  Dr. Raynelle Bring, [facility]  Radiation Oncologist - Dr. Tyler Pita, [facility]   Medical Oncologist - Dr. Zola Button, [place]  Recommendations: 1) Active surveillance 2) PSMA scan this fall  * These recommendations are based on information available as of today's consult.      Recommendations may change depending on the results of further tests or exams.  Next Steps: 1) Follow up with Dr. Alinda Money    When appointments need to be scheduled, you will be contacted by [facility] and/or [facility].  Questions?  Please do not hesitate to call Cira Rue, RN, BSN, OCN at [phone] any questions or concerns.  [name] is your Oncology Nurse Navigator and is available to assist you while you're receiving your medical care at [facility].

## 2020-04-09 NOTE — Consult Note (Signed)
Multi-Disciplinary Clinic     04/09/2020   --------------------------------------------------------------------------------   Wesley Stanton  MRN: [id number]  DOB: 02-06-1941, 79 year old Male  SSN:-[id number]   PRIMARY CARE:  Wenda Low, MD  REFERRING:  Einar Crow, MD  PROVIDER:  Raynelle Bring, M.D.  LOCATION:  [facility] [phone]     --------------------------------------------------------------------------------   CC/HPI: CC: Prostate Cancer   PCP: Dr. Benita Stabile  Location of consult: [facility] - Prostate Cancer Multidisciplinary [facility]   Wesley Stanton is a very healthy and active 79 year old gentleman who was noted to have a new prostate nodule at the left mid prostate gland in October 2013 prompting a prostate biopsy on 06/28/12 which confirmed Gleason 4+3=7 adenocarcinoma with 2 out of 12 biopsy cores positive with both positive cores on the left side of the prostate. He has a family history of prostate cancer with his brother having been diagnosed and treated in 2013. He had a history of BPH with moderate symptoms on maximum alpha blocker therapy. Due to his excellent health and his voiding symptoms due to BPH, he elected surgical therapy and underwent a BNS RAL radical prostatectomy and BPLND on 08/11/12. His surgical pathology indicated pT3a N0 Mx, Gleason 3+4=7 adenocarcinoma with negative surgical margins. His PSA was undetectable postoperatively but began to slowly rise in 2018 reaching 0.22 in December 2020 indicating biochemical relapse. His PSA in March 2021 was 0.23 but increased to 0.32 in June 2021 and he became interested in the option of salvage radiation. His PSA was recently repeated on 03/25/20 and was 0.25.   He maintains good continence and uses sildenafil prn for erectile dysfunction.   Family history: Brother (treated in 2013)   PMH: He has a history of glaucoma, hyperlipidemia, and hypertension.      ALLERGIES: Sulfa Drugs    MEDICATIONS: Clotrimazole-Betamethasone 1 %-0.05 % cream Apply to affected area bid for 2 weeks. 45 g tube  Adult Aspirin Low Strength 81 MG TBDP Oral  Latanoprost 0.005 % drops  Lisinopril 10 MG Oral Tablet Oral  Sildenafil Citrate 20 mg tablet Take 2-5 tablets as needed for intimacy  Simvastatin 20 MG Oral Tablet Oral     GU PSH: Laparoscopy; Lymphadenectomy - 2014 Robotic Radical Prostatectomy - 2014 Varicocele repair - 2008       PSH Notes: Laparoscopy With Bilateral Total Pelvic Lymphadenectomy, Prostatect Retropubic Radical W/ Nerve Sparing Laparoscopic, Surgery Spermatic Cord Excision Of Varicocele   NON-GU PSH: No Non-GU PSH    GU PMH: Balanitis - 2018 ED due to arterial insufficiency, Erectile dysfunction due to arterial insufficiency - 2017 Prostate Cancer, Prostate cancer - 2017      PMH Notes:   1) Prostate Cancer: He is s/p a BNS RAL radical prostatectomy and BPLND on 08/11/12. His PSA has been undetectable since surgery. He developed a biochemical recurrence in December 2020.   Diagnosis: pT3a N0 Mx, Gleason 3+4=7 adenocarcinoma with negative surgical margins  Pretreatment PSA: 2.94  Pretreatment SHIM: 18 Aug 2012: Radical prostatectomy  Dec 2020: BCR   2) Microscopic hematuria: He underwent a complete urologic evaluation by Dr. Reece Agar in 2006 which was negative.   3) Erectile dysfunction: He has mild erectile dysfunction and occasionally took Viagra prior to his prostatectomy.   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     NON-GU PMH: Glaucoma Hypercholesterolemia Hypertension    FAMILY  HISTORY: nephrolithiasis - Father Prostate Cancer - Brother Uterine Cancer - Mother   SOCIAL HISTORY: Marital Status: Divorced Preferred Language: English; Ethnicity: Not Hispanic Or Latino; Race: White     Notes: Former smoker, Marital History -  Divorced, Occupation:, Tobacco Use, Alcohol Use   REVIEW OF SYSTEMS:    GU Review Male:   Patient denies frequent urination, hard to postpone urination, burning/ pain with urination, get up at night to urinate, leakage of urine, stream starts and stops, trouble starting your streams, and have to strain to urinate .  Gastrointestinal (Lower):   Patient denies diarrhea and constipation.  Gastrointestinal (Upper):   Patient denies nausea and vomiting.  Constitutional:   Patient denies fever, night sweats, weight loss, and fatigue.  Skin:   Patient denies skin rash/ lesion and itching.  Eyes:   Patient denies blurred vision and double vision.  Ears/ Nose/ Throat:   Patient denies sore throat and sinus problems.  Hematologic/Lymphatic:   Patient denies swollen glands and easy bruising.  Cardiovascular:   Patient denies leg swelling and chest pains.  Respiratory:   Patient denies cough and shortness of breath.  Endocrine:   Patient denies excessive thirst.  Musculoskeletal:   Patient denies back pain and joint pain.  Neurological:   Patient denies headaches and dizziness.  Psychologic:   Patient denies anxiety and depression.   VITAL SIGNS: None   MULTI-SYSTEM PHYSICAL EXAMINATION:    Constitutional: Well-nourished. No physical deformities. Normally developed. Good grooming.     Complexity of Data:  Lab Test Review:   PSA  Records Review:   Pathology Reports, Previous Patient Records   03/25/20 01/24/20 10/17/19 07/12/19 12/13/18 06/08/18 12/06/17 05/25/17  PSA  Total PSA 0.25 ng/mL 0.32 ng/mL 0.23 ng/mL 0.22 ng/mL 0.16 ng/mL 0.13 ng/mL 0.144 ng/mL 0.10 ng/mL    PROCEDURES: None   ASSESSMENT: None   PLAN:           Document Letter(s):  Created for Patient: Clinical Summary         Notes:   1. Biochemically recurrent prostate cancer: He was thoroughly counseled by both Dr. Alen Blew and Dr. Tammi Klippel earlier this afternoon. We further discussed his options today which would include  continued observation preserving systemic therapy in the future when/if needed. We also discussed the option proceeding with salvage radiation therapy possibly with androgen deprivation and the potential risks/side effects associated with this approach. He remains exceedingly and exceptionally healthy at age 30 and still has an excellent life expectancy.   After further discussion, he is most interested in delaying any further treatment at least until the winter regardless. As such, we have discussed having him follow up with me later in the fall with a PSA. We then discussed possibly proceeding with a PSMA PET scan which should be hopefully readily available to help direct appropriate treatment decisions and make a final decision about the option of salvage radiation therapy. He feels comfortable with this approach and we will proceed accordingly.   2. Erectile dysfunction: Continue sildenafil prn   Cc: Dr. Wenda Low  Dr. Zola Button  Dr. Tyler Pita

## 2020-04-09 NOTE — Progress Notes (Signed)
GU Location of Tumor / Histology: biochemical recurrent prostatic adenocarcinoma s/p radical prostatectomy 08/11/2012  If Prostate Cancer, Gleason Score is (3 + 4) and PSA is (2.94) pre treatment.  EMERSEN CARROLL had a radical prostatectomy 08/11/2012 with clear margins. Patient diagnosed with biochemical reoccurrence in December 2020.       Past/Anticipated interventions by urology, if any: prostate biopsy, prostatectomy, surveillance, ordered PET (not done yet), referral to [facility]  Past/Anticipated interventions by medical oncology, if any: no  Weight changes, if any: no  Bowel/Bladder complaints, if any: IPSS 5. SHIM 14. Reports occasional dribbling urine. Denies any bowel complaints.   Nausea/Vomiting, if any: no  Pain issues, if any:  denies  SAFETY ISSUES:  Prior radiation? denies  Pacemaker/ICD? denies  Possible current pregnancy? no, male patient  Is the patient on methotrexate? denies  Current Complaints / other details:  79 year old male. Divorced. Has significant other, [place]. Patient in excellent health for his age. He remains active walking 1 mile per day, playing golf, and dancing. Former smoker. Mother with hx of uterine cancer. Brother with hx of prostate cancer. Patient reports glaucoma is suspected. Reports hearing loss. Reports cancerous lesion was removed from his face last year.

## 2020-04-09 NOTE — Progress Notes (Signed)
Reason for the request:    Prostate cancer  HPI: I was asked by Dr. Alinda Money   to evaluate Wesley Stanton for evaluation of prostate cancer.  He is a healthy 79 year old man with history of hyperlipidemia and hypertension who was found to have a normal digital rectal examination by Dr. Alinda Money in 2013.  He had a prostate nodule at that time.  Prostate biopsy showed Gleason score 3+4 = 7 and 4+3 = 7 to 12 cores.  Based on these findings he underwent radical prostatectomy and bilateral lymph node dissection is robotically assisted.  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  0 out of 3 lymph nodes were involved with negative margins.  He remained under active surveillance with PSA started to rise in 2018.  In December 2020 his PSA was 0.2.  In August 2016 his PSA was 0.5.  Based on these findings he was referred to the prostate cancer multidisciplinary clinic to discuss treatment options.  Clinically, he is asymptomatic at this time.  He has remained continent since his surgery and does report very little to no incontinence.  Denies any pelvic pain or discomfort.  Continues to be an excellent health and shape with normal activity level and performance status.  Continues to have excellent quality of life.  He does not report any headaches, blurry vision, syncope or seizures. Does not report any fevers, chills or sweats.  Does not report any cough, wheezing or hemoptysis.  Does not report any chest pain, palpitation, orthopnea or leg edema.  Does not report any nausea, vomiting or abdominal pain.  Does not report any constipation or diarrhea.  Does not report any skeletal complaints.    Does not report frequency, urgency or hematuria.  Does not report any skin rashes or lesions. Does not report any heat or cold intolerance.  Does not report any lymphadenopathy or petechiae.  Does not report any anxiety or depression.  Remaining review of systems is negative.     Past Medical History:  Diagnosis Date  . BPH (benign prostatic hyperplasia)   . Deviated septum    PT STATES TOLD HE SNORES-TOLD HE HAS DEVIATED SEPTUM  . Diverticulitis    OCTOBER 2013  . ED (erectile dysfunction)   . Hypercholesterolemia   . Hypertension   . Prostate cancer (Wesley Stanton) 06/28/12   bx=Adenocarcinoma,PSA=2.94,gleason=3+4=7 & 4+3=7,volume=60.4cc  . Urinary frequency    AND NOCTURIA  :  Past Surgical History:  Procedure Laterality Date  . LYMPHADENECTOMY  08/11/2012   Procedure: LYMPHADENECTOMY;  Surgeon: Dutch Gray, MD;  Location: [facility];  Service: Urology;  Laterality: Bilateral;  . PROSTATE BIOPSY  06/28/12   Adenocarcinoma  . ROBOT ASSISTED LAPAROSCOPIC RADICAL PROSTATECTOMY  08/11/2012   Procedure: ROBOTIC ASSISTED LAPAROSCOPIC RADICAL PROSTATECTOMY LEVEL 2;  Surgeon: Dutch Gray, MD;  Location: [facility];  Service: Urology;  Laterality: N/A;     . TONSILLECTOMY     as a child  . VARIOCOCELE REPAIR     1970's repair  . [place]     AND ANOTHER WISDOM TOOTH REMOVED 2011  :   Current Outpatient Medications:  .  ciprofloxacin (CIPRO) 500 MG tablet, Take 500 mg by mouth 2 (two) times daily. Start day prior to office visit for foley removal, Disp: , Rfl:  .  clotrimazole-betamethasone (LOTRISONE) cream, Apply topically 2 (two) times daily., Disp: , Rfl:  .  latanoprost (XALATAN) 0.005 % ophthalmic solution, 1 drop  at bedtime., Disp: , Rfl:  .  lisinopril (PRINIVIL,ZESTRIL) 10 MG tablet, Take 10 mg by mouth every morning. , Disp: , Rfl:  .  simvastatin (ZOCOR) 20 MG tablet, Take 20 mg by mouth daily., Disp: , Rfl:  .  Zoster Vaccine Adjuvanted (SHINGRIX) injection, TO BE ADMINISTERED BY PHARMACIST FOR IMMUNIZATION, Disp: , Rfl: :  Allergies  Allergen Reactions  . Sulfa Antibiotics     High fever,  :  Family History  Problem Relation Age of Onset  . Uterine cancer Mother   . Prostate cancer Brother   . Breast cancer Neg Hx   . Colon cancer  Neg Hx   . Pancreatic cancer Neg Hx   :  Social History   Socioeconomic History  . Marital status: Single    Spouse name: Not on file  . Number of children: Not on file  . Years of education: Not on file  . Highest education level: Not on file  Occupational History    Comment: retired  Tobacco Use  . Smoking status: Former Smoker    Packs/day: 0.50    Years: 20.00    Pack years: 10.00    Types: Cigarettes    Quit date: 08/10/1989    Years since quitting: 30.6  . Smokeless tobacco: Never Used  . Tobacco comment: quit smoking 30 years ago  Vaping Use  . Vaping Use: Never used  Substance and Sexual Activity  . Alcohol use: Yes    Comment: wine-COUPLE GLASSES A DAY  . Drug use: No  . Sexual activity: Yes  Other Topics Concern  . Not on file  Social History Narrative  . Not on file   Social Determinants of Health   Financial Resource Strain:   . Difficulty of Paying Living Expenses: Not on file  Food Insecurity:   . Worried About Charity fundraiser in the Last Year: Not on file  . Ran Out of Food in the Last Year: Not on file  Transportation Needs:   . Lack of Transportation (Medical): Not on file  . Lack of Transportation (Non-Medical): Not on file  Physical Activity:   . Days of Exercise per Week: Not on file  . Minutes of Exercise per Session: Not on file  Stress:   . Feeling of Stress : Not on file  Social Connections:   . Frequency of Communication with Friends and Family: Not on file  . Frequency of Social Gatherings with Friends and Family: Not on file  . Attends Religious Services: Not on file  . Active Member of Clubs or Organizations: Not on file  . Attends Archivist Meetings: Not on file  . Marital Status: Not on file  Intimate Partner Violence:   . Fear of Current or Ex-Partner: Not on file  . Emotionally Abused: Not on file  . Physically Abused: Not on file  . Sexually Abused: Not on file  :  Pertinent items are noted in  HPI.  Exam: ECOG 0 General appearance: alert and cooperative appeared without distress. Head: atraumatic without any abnormalities. Eyes: conjunctivae/corneas clear. PERRL.  Sclera anicteric. Throat: lips, mucosa, and tongue normal; without oral thrush or ulcers. Resp: clear to auscultation bilaterally without rhonchi, wheezes or dullness to percussion. Cardio: regular rate and rhythm, S1, S2 normal, no murmur, click, rub or gallop GI: soft, non-tender; bowel sounds normal; no masses,  no organomegaly Skin: Skin color, texture, turgor normal. No rashes or lesions Lymph nodes: Cervical, supraclavicular, and axillary nodes normal.  Neurologic: Grossly normal without any motor, sensory or deep tendon reflexes. Musculoskeletal: No joint deformity or effusion.   Assessment and Plan:   79 year old man with prostate cancer diagnosed in October 2013.  He was found to have Gleason score 3+4 = 7 and PSA of 2.94.  He underwent a radical prostatectomy with a final pathology showed a T3a N0 3+4 = 7 with negative margins.  And 0 out of 3 nodes involved.  He developed biochemical relapse with current PSA at 0.25 and slowly rising.  His case was discussed today in the prostate cancer multidisciplinary clinic.  Treatment options were reviewed at this time.  Pathology results were also discussed with the reviewing pathologist.  Despite his age, he continues to be in excellent health and shape without any decline in his performance status and quality of life.'s anticipated life expectancy likely in the double-digit years.  Given these findings aggressive measures are warranted at this time given the fact that he might be at risk of developing systemic measurable disease.  Treatment options include active surveillance versus initiating salvage radiation therapy.  The role for additional systemic therapy were discussed today and currently not needed.  The role of additional hormone therapy were reviewed at this  time advised against it given his overall presentation and low risk disease.  He will consider these options and have a discussion with Dr. Tammi Klippel regarding radiation therapy.  45  minutes were dedicated to this visit. The time was spent on reviewing laboratory data, pathology results, discussing treatment options,  and answering questions regarding future plan.    A copy of this consult has been forwarded to the requesting physician.

## 2020-06-27 ENCOUNTER — Encounter: Payer: Self-pay | Admitting: Medical Oncology

## 2020-06-27 NOTE — Progress Notes (Signed)
Patient left message asking about PET scan that was discussed in [facility]. He thought it would be ordered for early December but he has not been contacted.I will follow up with Dr. Alinda Money and call patient.

## 2020-06-27 NOTE — Progress Notes (Signed)
Left message to inform him I spoke with Dr. Alinda Money regarding PET. Dr. Alinda Money is waiting PSA results 12/1 to determine if he orders PET. He will discuss with patient at his visit early December. I asked him to call me if he needs to discuss further.

## 2020-08-06 ENCOUNTER — Encounter: Payer: Self-pay | Admitting: Medical Oncology

## 2020-08-06 NOTE — Progress Notes (Signed)
Patient left a mesage stating he received a letter to confirm pre certification for PET scan. He has not been contacted with an appointment and is not sure how to schedule. He also has a couple of trips planned for January and did not know if he can to  start radiation in February. I called [facility] to follow up on PET.  I was informed that an order was faxed to [facility] at [facility] radiology on 12/23 but appointment has not been made. I attempted to follow up with Ladona Ridgel but she has gone for the day. I called and left patient an update.I told him he should be ok to take his trips and  begin radiation in February but he can discuss with Dr. Laverle Patter when he calls with results.

## 2020-08-07 ENCOUNTER — Other Ambulatory Visit (HOSPITAL_COMMUNITY): Payer: Self-pay | Admitting: Urology

## 2020-08-07 ENCOUNTER — Encounter: Payer: Self-pay | Admitting: Medical Oncology

## 2020-08-07 DIAGNOSIS — C61 Malignant neoplasm of prostate: Secondary | ICD-10-CM

## 2020-08-07 NOTE — Progress Notes (Signed)
Spoke with Wesley Stanton in radiology regarding scheduling PET. She spoke with patient earlier today and he will call her back to schedule due to another appointment he has.

## 2020-08-12 ENCOUNTER — Encounter: Payer: Self-pay | Admitting: Medical Oncology

## 2020-08-12 NOTE — Progress Notes (Signed)
Patient had questions about PSMA scan. All questions were answered. He has not scheduled yet, but did speak with Ladona Ridgel last week. He will call her today to schedule. I asked him to call me if he has any trouble. He voiced understanding.

## 2020-08-27 ENCOUNTER — Ambulatory Visit (HOSPITAL_COMMUNITY)
Admission: RE | Admit: 2020-08-27 | Discharge: 2020-08-27 | Disposition: A | Discharge status: Home / Self Care | Payer: Medicare Other | Source: Ambulatory Visit | Attending: Urology | Admitting: Urology

## 2020-08-27 ENCOUNTER — Other Ambulatory Visit: Payer: Self-pay

## 2020-08-27 DIAGNOSIS — C61 Malignant neoplasm of prostate: Secondary | ICD-10-CM | POA: Diagnosis present

## 2020-08-27 MED ORDER — PIFLIFOLASTAT F 18 (PYLARIFY) INJECTION
9.0000 | Freq: Once | INTRAVENOUS | Status: AC
  Administered 2020-08-27: 8 via INTRAVENOUS

## 2022-11-27 IMAGING — PT NM PET TUM IMG SKULL BASE T - THIGH
7 series · 25 of 25 positions shown · non-contrast
Comparison: CT 06/19/2019

CLINICAL DATA: Prostate carcinoma with biochemical recurrence. PSA
equal 0.35. Post prostatectomy.

EXAM:
NUCLEAR MEDICINE PET SKULL BASE TO THIGH
TECHNIQUE: 8.2 mCi F18 Piflufolastat (Pylarify) was injected intravenously.
Full-ring PET imaging was performed from the skull base to thigh
after the radiotracer. CT data was obtained and used for attenuation
correction and anatomic localization.

[Series 3: pet sk_thigh ac · axial · 5.0mm · 4.07mm/px · z∈[-880,+72]mm · 6 of 239 slices shown]
[im 1/239]
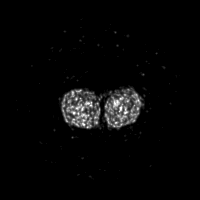
[im 48/239]
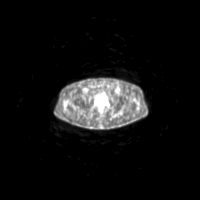
[im 96/239]
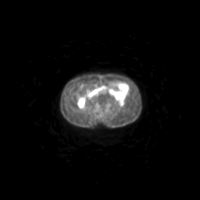
[im 143/239]
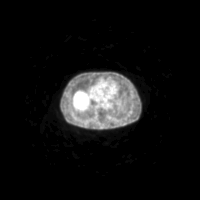
[im 191/239]
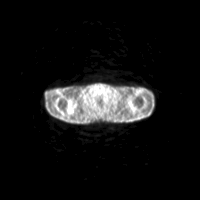
[im 239/239]
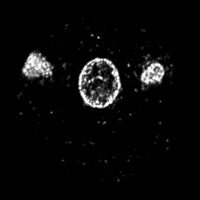

[Series 4: ct sk_thigh 5.0 bf37 · axial · 5.0mm · 0.98mm/px · z∈[-880,+72]mm · 5 of 239 slices shown]
[im 1/239]
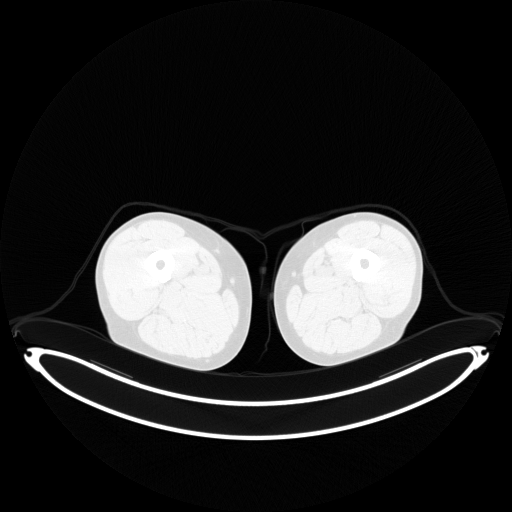
[im 60/239]
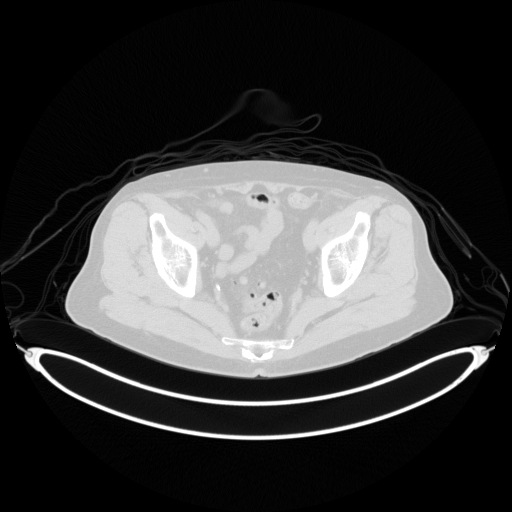
[im 120/239]
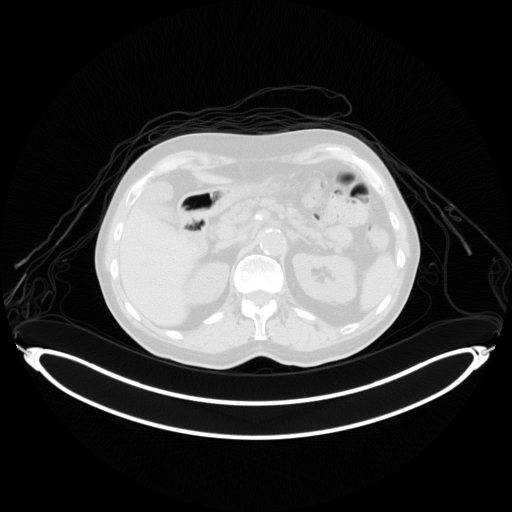
[im 179/239]
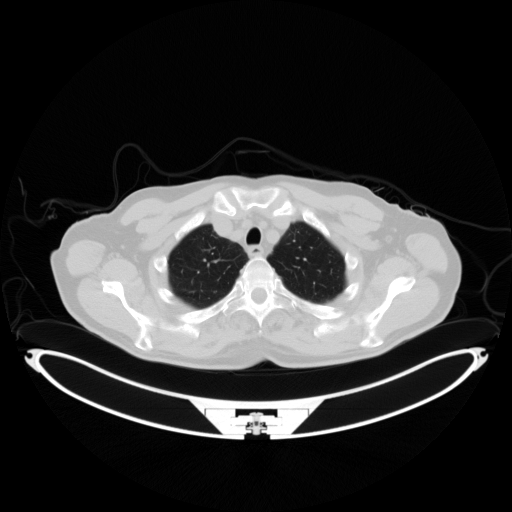
[im 239/239  brain]
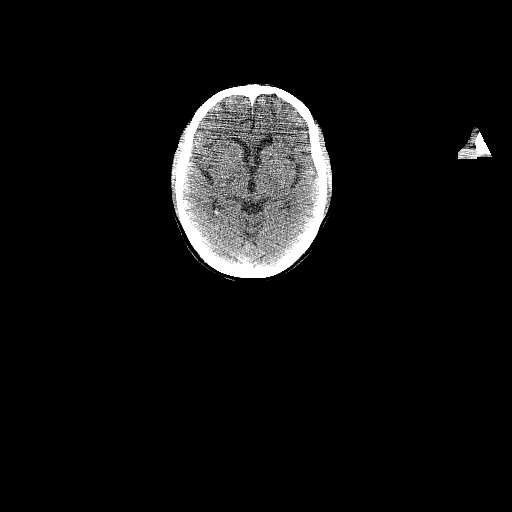

[Series 5: pet sk_thigh nac · axial · 5.0mm · 4.07mm/px · z∈[-880,+72]mm · 5 of 239 slices shown]
[im 1/239]
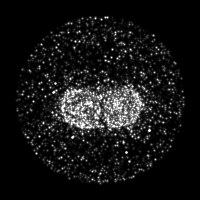
[im 60/239]
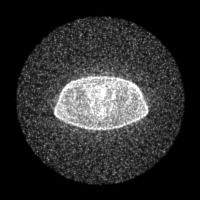
[im 120/239]
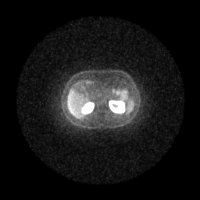
[im 179/239]
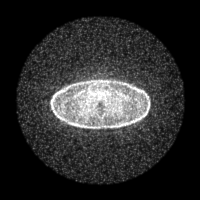
[im 239/239]
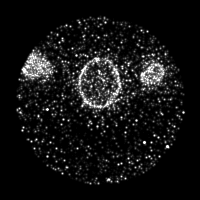

[Series 8: ct sk_thigh 5.0 br59 (id)_bone · axial · 5.0mm · 0.67mm/px · z∈[-380,-108]mm · 2 of 69 slices shown]
[im 1/69]
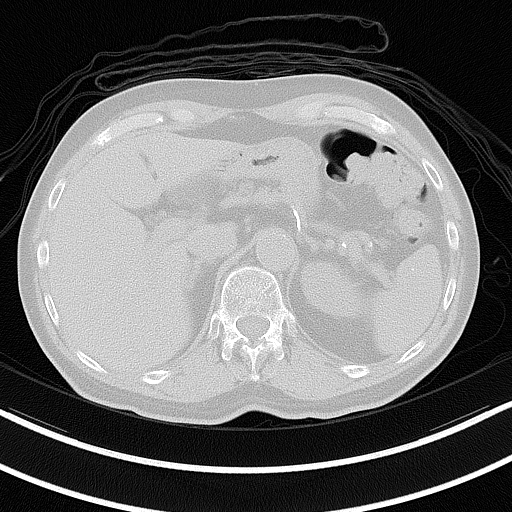
[im 69/69]
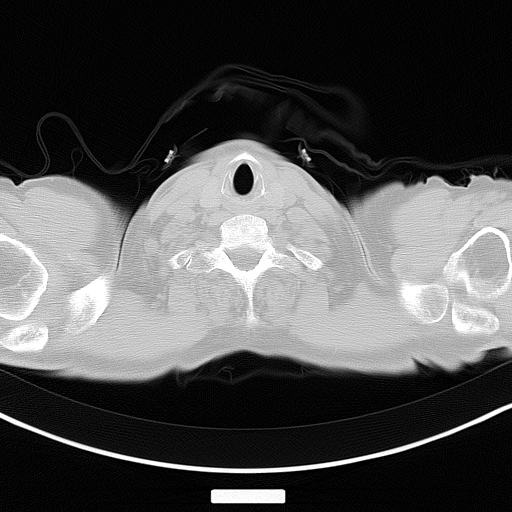

[Series 603: <mip collection> · coronal · 1.98mm/px · 1 of 32 slices shown]
[im 1/32]
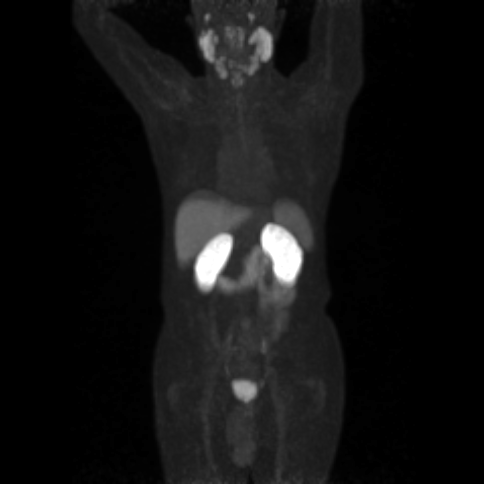

[Series 604: fused cor · 1 of 59 slices shown]
[im 1/59]
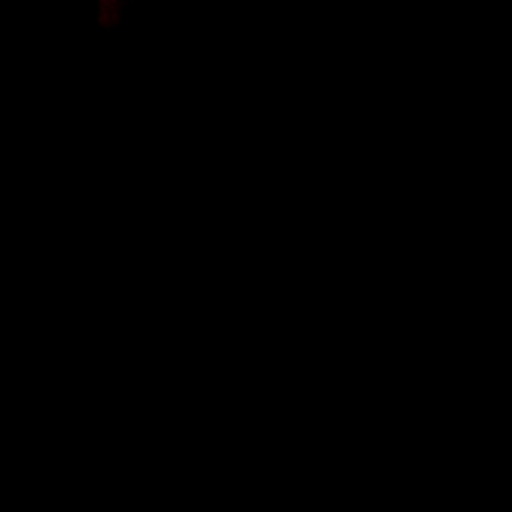

[Series 605: range-ct sk_thigh 5.0 bf37-tra-<alpha range> · 5 of 230 slices shown]
[im 1/230]
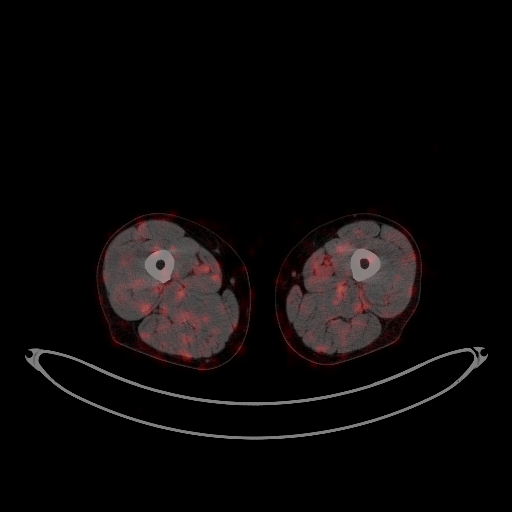
[im 58/230]
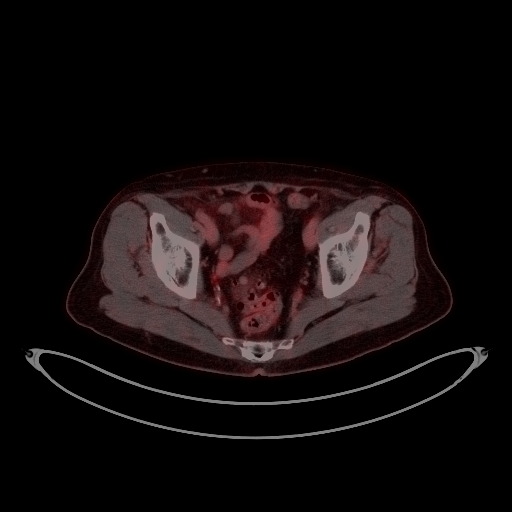
[im 115/230]
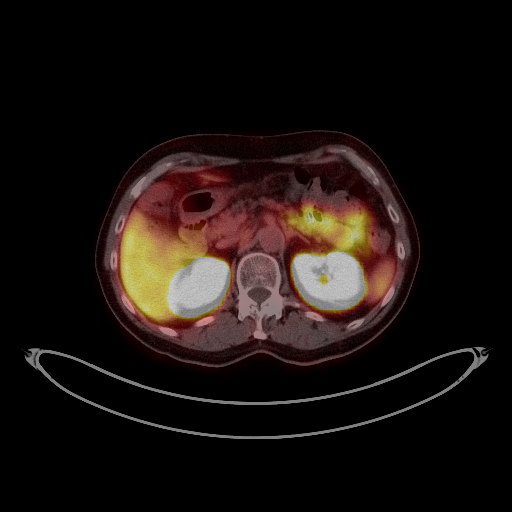
[im 172/230]
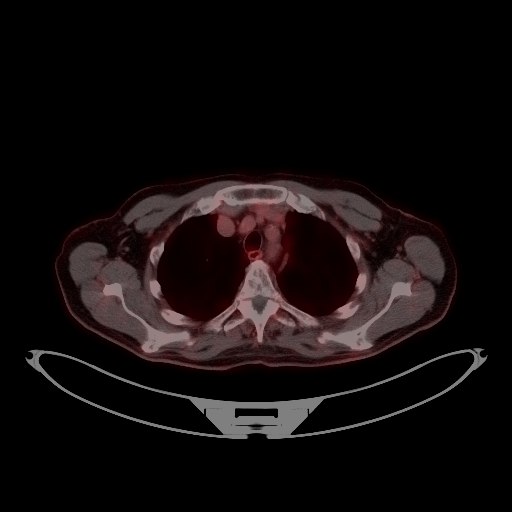
[im 230/230]
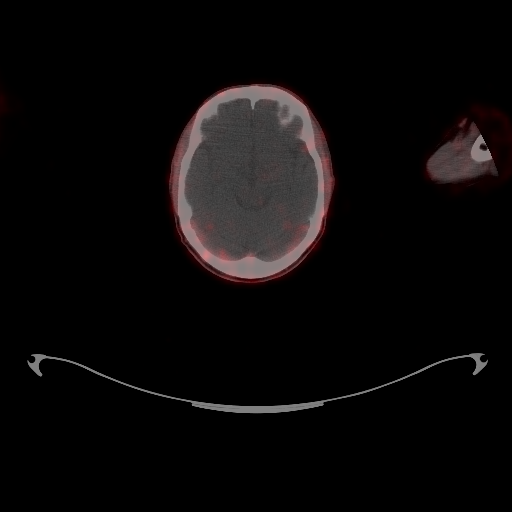

[25 of 25 positions shown; findings below may reference images not displayed]

FINDINGS: NECK

No radiotracer activity in neck lymph nodes.

Incidental CT finding: None

CHEST

No radiotracer accumulation within mediastinal or hilar lymph nodes.
No suspicious pulmonary nodules on the CT scan.

Incidental CT finding: None

ABDOMEN/PELVIS

Prostate: No focal activity in the prostatectomy bed.

Small bladder diverticulum along the anterior LEFT margin of the
bladder collects urine radiotracer (image 191).

Lymph nodes: No abnormal radiotracer accumulation within pelvic or
abdominal nodes.

Liver: No evidence of liver metastasis

Incidental CT finding: Sigmoid diverticulosis. Atherosclerotic
calcification of the aorta.

SKELETON

No focal  activity to suggest skeletal metastasis.
IMPRESSION: 1. No evidence of prostate cancer recurrence in the prostatectomy
bed.
2. Evidence of metastatic lymphadenopathy.
3. No visceral metastasis or skeletal metastasis

## 2024-06-12 NOTE — Progress Notes (Signed)
 Surgery orders requested via Epic inbox.

## 2024-06-13 ENCOUNTER — Ambulatory Visit: Payer: Self-pay | Admitting: General Surgery

## 2024-06-13 NOTE — Progress Notes (Signed)
 DR Polly,      You did not completed procedure for consent form .  Prep on 06/19/24.  Thank You.

## 2024-06-13 NOTE — Progress Notes (Signed)
 Anesthesia Review:  PCP: Tanda Bame  Cardiologist :  PPM/ ICD: Device Orders: Rep Notified:  Chest x-ray : EKG : Echo : Stress test: Cardiac Cath :   Activity level:  Sleep Study/ CPAP : Fasting Blood Sugar :      / Checks Blood Sugar -- times a day:    Blood Thinner/ Instructions /Last Dose: ASA / Instructions/ Last Dose :

## 2024-06-14 NOTE — Patient Instructions (Addendum)
 SURGICAL WAITING ROOM VISITATION  Patients having surgery or a procedure may have no more than 2 support people in the waiting area - these visitors may rotate.    Children under the age of 65 must have an adult with them who is not the patient.  Visitors with respiratory illnesses are discouraged from visiting and should remain at home.  If the patient needs to stay at the hospital during part of their recovery, the visitor guidelines for inpatient rooms apply. Pre-op nurse will coordinate an appropriate time for 1 support person to accompany patient in pre-op.  This support person may not rotate.    Please refer to the [facility] website for the visitor guidelines for Inpatients (after your surgery is over and you are in a regular room).       Your procedure is scheduled on: 07/03/24    Report to [facility] Main Entrance    Report to admitting at  0515 AM   Call this number if you have problems the morning of surgery [phone]   Do not eat food :After Midnight.   After Midnight you may have the following liquids until _ 0430_____ AM/ PM DAY OF SURGERY  Water  Non-Citrus Juices (without pulp, NO RED-Apple, White grape, White cranberry) Black Coffee (NO MILK/CREAM OR CREAMERS, sugar ok)  Clear Tea (NO MILK/CREAM OR CREAMERS, sugar ok) regular and decaf                             Plain Jell-O (NO RED)                                           Fruit ices (not with fruit pulp, NO RED)                                     Popsicles (NO RED)                                                               Sports drinks like Gatorade (NO RED)                            If you have questions, please contact your surgeon's office.     Oral Hygiene is also important to reduce your risk of infection.                                    Remember - BRUSH YOUR TEETH THE MORNING OF SURGERY WITH YOUR REGULAR TOOTHPASTE  DENTURES WILL BE REMOVED PRIOR TO SURGERY PLEASE DO NOT  APPLY Poly grip OR ADHESIVES!!!   Do NOT smoke after Midnight   Stop all vitamins and herbal supplements 7 days before surgery.   Take these medicines the morning of surgery with A SIP OF WATER : none   DO NOT TAKE ANY ORAL DIABETIC MEDICATIONS DAY OF YOUR SURGERY  Bring CPAP mask and tubing day of surgery.  You may not have any metal on your body including hair pins, jewelry, and body piercing             Do not wear make-up, lotions, powders, perfumes/cologne, or deodorant  Do not wear nail polish including gel and S&S, artificial/acrylic nails, or any other type of covering on natural nails including finger and toenails. If you have artificial nails, gel coating, etc. that needs to be removed by a nail salon please have this removed prior to surgery or surgery may need to be canceled/ delayed if the surgeon/ anesthesia feels like they are unable to be safely monitored.   Do not shave  48 hours prior to surgery.               Men may shave face and neck.   Do not bring valuables to the hospital. [facility] IS NOT             RESPONSIBLE   FOR VALUABLES.   Contacts, glasses, dentures or bridgework may not be worn into surgery.   Bring small overnight bag day of surgery.   DO NOT BRING YOUR HOME MEDICATIONS TO THE HOSPITAL. PHARMACY WILL DISPENSE MEDICATIONS LISTED ON YOUR MEDICATION LIST TO YOU DURING YOUR ADMISSION IN THE HOSPITAL!    Patients discharged on the day of surgery will not be allowed to drive home.  Someone NEEDS to stay with you for the first 24 hours after anesthesia.   Special Instructions: Bring a copy of your healthcare power of attorney and living will documents the day of surgery if you haven't scanned them before.              Please read over the following fact sheets you were given: IF YOU HAVE QUESTIONS ABOUT YOUR PRE-OP INSTRUCTIONS PLEASE CALL [phone].   If you received a COVID test during your pre-op visit  it is  requested that you wear a mask when out in public, stay away from anyone that may not be feeling well and notify your surgeon if you develop symptoms. If you test positive for Covid or have been in contact with anyone that has tested positive in the last 10 days please notify you surgeon.    [facility] - Preparing for Surgery Before surgery, you can play an important role.  Because skin is not sterile, your skin needs to be as free of germs as possible.  You can reduce the number of germs on your skin by washing with CHG (chlorahexidine gluconate) soap before surgery.  CHG is an antiseptic cleaner which kills germs and bonds with the skin to continue killing germs even after washing. Please DO NOT use if you have an allergy to CHG or antibacterial soaps.  If your skin becomes reddened/irritated stop using the CHG and inform your nurse when you arrive at Short Stay. Do not shave (including legs and underarms) for at least 48 hours prior to the first CHG shower.  You may shave your face/neck.  Please follow these instructions carefully:  1.  Shower with CHG Soap the night before surgery ONLY (DO NOT USE THE SOAP THE MORNING OF SURGERY).  2.  If you choose to wash your hair, wash your hair first as usual with your normal  shampoo.  3.  After you shampoo, rinse your hair and body thoroughly to remove the shampoo.  4.  Use CHG as you would any other liquid soap.  You can apply chg directly to the skin and wash.  Gently with a scrungie or clean washcloth.  5.  Apply the CHG Soap to your body ONLY FROM THE NECK DOWN.   Do   not use on face/ open                           Wound or open sores. Avoid contact with eyes, ears mouth and   genitals (private parts).                       Wash face,  Genitals (private parts) with your normal soap.             6.  Wash thoroughly, paying special attention to the area where your    surgery  will be performed.  7.  Thoroughly rinse your body  with warm water  from the neck down.  8.  DO NOT shower/wash with your normal soap after using and rinsing off the CHG Soap.                9.  Pat yourself dry with a clean towel.            10.  Wear clean pajamas.            11.  Place clean sheets on your bed the night of your first shower and do not  sleep with pets. Day of Surgery : Do not apply any CHG, lotions/deodorants the morning of surgery.  Please wear clean clothes to the hospital/surgery center.  FAILURE TO FOLLOW THESE INSTRUCTIONS MAY RESULT IN THE CANCELLATION OF YOUR SURGERY  PATIENT SIGNATURE_________________________________  NURSE SIGNATURE__________________________________  ________________________________________________________________________

## 2024-06-15 ENCOUNTER — Ambulatory Visit: Payer: Self-pay | Admitting: General Surgery

## 2024-06-19 ENCOUNTER — Encounter (HOSPITAL_COMMUNITY)
Admission: RE | Admit: 2024-06-19 | Discharge: 2024-06-19 | Disposition: A | Discharge status: Home / Self Care | Source: Ambulatory Visit | Attending: Internal Medicine | Admitting: Internal Medicine

## 2024-06-22 NOTE — Progress Notes (Addendum)
 Date of COVID positive in last 90 days:  PCP - Ardell Manly, MD Cardiologist - n/a  Chest x-ray - N/A EKG - 06/23/24 Epic/chart Stress Test - years ago per pt ECHO - N/A Cardiac Cath - N/A Pacemaker/ICD device last checked:N/A Spinal Cord Stimulator:N/A  Bowel Prep - N/A  Sleep Study - N/A CPAP -   Fasting Blood Sugar - preDM Checks Blood Sugar no meds or checks at home  Last dose of GLP1 agonist-  N/A GLP1 instructions:  Do not take after     Last dose of SGLT-2 inhibitors-  N/A SGLT-2 instructions:  Do not take after     Blood Thinner Instructions: N/A Last dose:   Time: Aspirin Instructions: ASA 81, hold 5-7 days Last Dose:  Activity level: Can go up a flight of stairs and perform activities of daily living without stopping and without symptoms of chest pain or shortness of breath.  Anesthesia review: carotid arterial disease, HTN, aortic atherosclerosis, STOPBANG 5  Patient denies shortness of breath, fever, cough and chest pain at PAT appointment  Patient verbalized understanding of instructions that were given to them at the PAT appointment. Patient was also instructed that they will need to review over the PAT instructions again at home before surgery.

## 2024-06-22 NOTE — Patient Instructions (Signed)
 SURGICAL WAITING ROOM VISITATION  Patients having surgery or a procedure may have no more than 2 support people in the waiting area - these visitors may rotate.    Children under the age of 74 must have an adult with them who is not the patient.  Visitors with respiratory illnesses are discouraged from visiting and should remain at home.  If the patient needs to stay at the hospital during part of their recovery, the visitor guidelines for inpatient rooms apply. Pre-op nurse will coordinate an appropriate time for 1 support person to accompany patient in pre-op.  This support person may not rotate.    Please refer to the [facility] website for the visitor guidelines for Inpatients (after your surgery is over and you are in a regular room).    Your procedure is scheduled on: 07/03/24   Report to [facility] Main Entrance    Report to admitting at 5:15 AM   Call this number if you have problems the morning of surgery [phone]   Do not eat food :After Midnight.   After Midnight you may have the following liquids until 4:30 AM DAY OF SURGERY  Water  Non-Citrus Juices (without pulp, NO RED-Apple, White grape, White cranberry) Black Coffee (NO MILK/CREAM OR CREAMERS, sugar ok)  Clear Tea (NO MILK/CREAM OR CREAMERS, sugar ok) regular and decaf                             Plain Jell-O (NO RED)                                           Fruit ices (not with fruit pulp, NO RED)                                     Popsicles (NO RED)                                                               Sports drinks like Gatorade (NO RED)                      If you have questions, please contact your surgeon's office.   FOLLOW BOWEL PREP AND ANY ADDITIONAL PRE OP INSTRUCTIONS YOU RECEIVED FROM YOUR SURGEON'S OFFICE!!!     Oral Hygiene is also important to reduce your risk of infection.                                    Remember - BRUSH YOUR TEETH THE MORNING OF SURGERY WITH YOUR  REGULAR TOOTHPASTE  DENTURES WILL BE REMOVED PRIOR TO SURGERY PLEASE DO NOT APPLY Poly grip OR ADHESIVES!!!   Stop all vitamins and herbal supplements 7 days before surgery.   Take these medicines the morning of surgery with A SIP OF WATER : None  You may not have any metal on your body including jewelry, and body piercing             Do not wear lotions, powders, cologne, or deodorant              Men may shave face and neck.   Do not bring valuables to the hospital. [facility] IS NOT             RESPONSIBLE   FOR VALUABLES.   Contacts, glasses, dentures or bridgework may not be worn into surgery.  DO NOT BRING YOUR HOME MEDICATIONS TO THE HOSPITAL. PHARMACY WILL DISPENSE MEDICATIONS LISTED ON YOUR MEDICATION LIST TO YOU DURING YOUR ADMISSION IN THE HOSPITAL!    Patients discharged on the day of surgery will not be allowed to drive home.  Someone NEEDS to stay with you for the first 24 hours after anesthesia.   Special Instructions: Bring a copy of your healthcare power of attorney and living will documents the day of surgery if you haven't scanned them before.              Please read over the following fact sheets you were given: IF YOU HAVE QUESTIONS ABOUT YOUR PRE-OP INSTRUCTIONS PLEASE CALL [phone].   If you received a COVID test during your pre-op visit  it is requested that you wear a mask when out in public, stay away from anyone that may not be feeling well and notify your surgeon if you develop symptoms. If you test positive for Covid or have been in contact with anyone that has tested positive in the last 10 days please notify you surgeon.     - Preparing for Surgery Before surgery, you can play an important role.  Because skin is not sterile, your skin needs to be as free of germs as possible.  You can reduce the number of germs on your skin by washing with CHG (chlorahexidine gluconate) soap before surgery.  CHG is  an antiseptic cleaner which kills germs and bonds with the skin to continue killing germs even after washing. Please DO NOT use if you have an allergy to CHG or antibacterial soaps.  If your skin becomes reddened/irritated stop using the CHG and inform your nurse when you arrive at Short Stay. Do not shave (including legs and underarms) for at least 48 hours prior to the first CHG shower.  You may shave your face/neck.  Please follow these instructions carefully:  1.  Shower with CHG Soap the night before surgery ONLY (DO NOT USE THE SOAP THE MORNING OF SURGERY).  2.  If you choose to wash your hair, wash your hair first as usual with your normal  shampoo.  3.  After you shampoo, rinse your hair and body thoroughly to remove the shampoo.                             4.  Use CHG as you would any other liquid soap.  You can apply chg directly to the skin and wash.  Gently with a scrungie or clean washcloth.  5.  Apply the CHG Soap to your body ONLY FROM THE NECK DOWN.   Do   not use on face/ open                           Wound or open sores. Avoid contact with eyes,  ears mouth and   genitals (private parts).                       Wash face,  Genitals (private parts) with your normal soap.             6.  Wash thoroughly, paying special attention to the area where your    surgery  will be performed.  7.  Thoroughly rinse your body with warm water  from the neck down.  8.  DO NOT shower/wash with your normal soap after using and rinsing off the CHG Soap.                9.  Pat yourself dry with a clean towel.            10.  Wear clean pajamas.            11.  Place clean sheets on your bed the night of your first shower and do not  sleep with pets. Day of Surgery : Do not apply any CHG, lotions/deodorants the morning of surgery.  Please wear clean clothes to the hospital/surgery center.  FAILURE TO FOLLOW THESE INSTRUCTIONS MAY RESULT IN THE CANCELLATION OF YOUR SURGERY  PATIENT  SIGNATURE_________________________________  NURSE SIGNATURE__________________________________  ________________________________________________________________________

## 2024-06-23 ENCOUNTER — Encounter (HOSPITAL_COMMUNITY): Payer: Self-pay

## 2024-06-23 ENCOUNTER — Encounter (HOSPITAL_COMMUNITY)
Admission: RE | Admit: 2024-06-23 | Discharge: 2024-06-23 | Disposition: A | Discharge status: Home / Self Care | Source: Ambulatory Visit | Attending: General Surgery | Admitting: General Surgery

## 2024-06-23 ENCOUNTER — Other Ambulatory Visit: Payer: Self-pay

## 2024-06-23 VITALS — BP 142/84 | HR 71 | Temp 97.8°F | Resp 16 | Ht 70.5 in | Wt 153.0 lb

## 2024-06-23 DIAGNOSIS — K4090 Unilateral inguinal hernia, without obstruction or gangrene, not specified as recurrent: Secondary | ICD-10-CM | POA: Insufficient documentation

## 2024-06-23 DIAGNOSIS — Z01818 Encounter for other preprocedural examination: Secondary | ICD-10-CM | POA: Insufficient documentation

## 2024-06-23 DIAGNOSIS — Z8546 Personal history of malignant neoplasm of prostate: Secondary | ICD-10-CM | POA: Diagnosis not present

## 2024-06-23 DIAGNOSIS — Z87891 Personal history of nicotine dependence: Secondary | ICD-10-CM | POA: Diagnosis not present

## 2024-06-23 DIAGNOSIS — Z01810 Encounter for preprocedural cardiovascular examination: Secondary | ICD-10-CM | POA: Diagnosis present

## 2024-06-23 DIAGNOSIS — Z01812 Encounter for preprocedural laboratory examination: Secondary | ICD-10-CM | POA: Diagnosis present

## 2024-06-23 DIAGNOSIS — I2510 Atherosclerotic heart disease of native coronary artery without angina pectoris: Secondary | ICD-10-CM | POA: Diagnosis not present

## 2024-06-23 DIAGNOSIS — I10 Essential (primary) hypertension: Secondary | ICD-10-CM | POA: Insufficient documentation

## 2024-06-23 LAB — CBC
HCT: 45.8 % (ref 39.0–52.0)
Hemoglobin: 15.6 g/dL (ref 13.0–17.0)
MCH: 33.9 pg (ref 26.0–34.0)
MCHC: 34.1 g/dL (ref 30.0–36.0)
MCV: 99.6 fL (ref 80.0–100.0)
Platelets: 231 K/uL (ref 150–400)
RBC: 4.6 MIL/uL (ref 4.22–5.81)
RDW: 12.3 % (ref 11.5–15.5)
WBC: 8.2 K/uL (ref 4.0–10.5)
nRBC: 0 % (ref 0.0–0.2)

## 2024-06-23 LAB — BASIC METABOLIC PANEL WITH GFR
Anion gap: 11 (ref 5–15)
BUN: 13 mg/dL (ref 8–23)
CO2: 25 mmol/L (ref 22–32)
Calcium: 9.9 mg/dL (ref 8.9–10.3)
Chloride: 101 mmol/L (ref 98–111)
Creatinine, Ser: 0.85 mg/dL (ref 0.61–1.24)
GFR, Estimated: >60 mL/min (ref >60)
Glucose, Bld: 101 mg/dL — ABNORMAL HIGH (ref 70–99)
Potassium: 4.3 mmol/L (ref 3.5–5.1)
Sodium: 137 mmol/L (ref 135–145)

## 2024-06-23 NOTE — Progress Notes (Signed)
   06/23/24 1036  OBSTRUCTIVE SLEEP APNEA  Have you ever been diagnosed with sleep apnea through a sleep study? No  Do you snore loudly (loud enough to be heard through closed doors)?  1  Do you often feel tired, fatigued, or sleepy during the daytime (such as falling asleep during driving or talking to someone)? 0  Has anyone observed you stop breathing during your sleep? 1  Do you have, or are you being treated for high blood pressure? 1  BMI more than 35 kg/m2? 0  Age > 50 (1-yes) 1  Neck circumference greater than:Male 16 inches or larger, Male 17inches or larger? 0  Male Gender (Yes=1) 1  Obstructive Sleep Apnea Score 5

## 2024-06-26 ENCOUNTER — Encounter (HOSPITAL_COMMUNITY): Payer: Self-pay

## 2024-06-26 NOTE — Progress Notes (Signed)
 Case: [id number] Date/Time: 07/03/24 0715   Procedure: REPAIR, HERNIA, INGUINAL, ROBOT-ASSISTED, LAPAROSCOPIC, USING MESH (Right) - ROBOTIC RIGHT INGUINAL HERNIA REPAIR WITH MESH POSSIBLE BILATERAL - GENERAL AND TAP BLOCK   Anesthesia type: General   Pre-op diagnosis: RIGHT INGUINAL HERNIA   [facility] ROOM 02 / [facility]   Surgeons: Polly Cordella LABOR, MD       DISCUSSION: Wesley Stanton is an 83 yo male with PMH of former smoking, HTN, STOP-BANG score 5, carotid artery disease, prostate cancer s/p prostatectomy (2014)  Seen by PCP on 04/05/24 for abdominal pain. KUB obtained which showed large stool burden. Diagnosed with constipation.  Seen by PCP on 03/30/24 for annual visit. All issues well controlled. Has known carotid artery disease, R>L. Last ultrasound in 2023 noted to be stable per PCP note.  Activity level: Can go up a flight of stairs and perform activities of daily living without stopping and without symptoms of chest pain or shortness of breath.  VS: BP (!) 142/84   Pulse 71   Temp 36.6 C (Oral)   Resp 16   Ht 5' 10.5 (1.791 m)   Wt 69.4 kg   SpO2 100%   BMI 21.64 kg/m   PROVIDERS: Ransom Other, MD   LABS: Labs reviewed: Acceptable for surgery. (all labs ordered are listed, but only abnormal results are displayed)  Labs Reviewed  BASIC METABOLIC PANEL WITH GFR - Abnormal; Notable for the following components:      Result Value   Glucose, Bld 101 (*)    All other components within normal limits  CBC     IMAGES:   EKG 06/23/24:  Normal sinus rhythm with sinus arrhythmia Lateral infarct , age undetermined Abnormal ECG When compared with ECG of 04-Aug-2012 09:23, No significant change was found   CV:  Past Medical History:  Diagnosis Date   Aortic atherosclerosis    BPH (benign prostatic hyperplasia)    Carotid arterial disease    Deviated septum    PT STATES TOLD HE SNORES-TOLD HE HAS DEVIATED SEPTUM   Diverticulitis    OCTOBER 2013    ED (erectile dysfunction)    Hypercholesterolemia    Hypertension    Prostate cancer (HCC) 06/28/12   bx=Adenocarcinoma,PSA=2.94,gleason=3+4=7 & 4+3=7,volume=60.4cc   Urinary frequency    AND NOCTURIA    Past Surgical History:  Procedure Laterality Date   LYMPHADENECTOMY  08/11/2012   Procedure: LYMPHADENECTOMY;  Surgeon: Noretta Ferrara, MD;  Location: [facility];  Service: Urology;  Laterality: Bilateral;   PROSTATE BIOPSY  06/28/12   Adenocarcinoma   ROBOT ASSISTED LAPAROSCOPIC RADICAL PROSTATECTOMY  08/11/2012   Procedure: ROBOTIC ASSISTED LAPAROSCOPIC RADICAL PROSTATECTOMY LEVEL 2;  Surgeon: Noretta Ferrara, MD;  Location[facility];  Service: Urology;  Laterality: N/A;      SKIN SURGERY  04/05/2019   TONSILLECTOMY     as a child   VARIOCOCELE REPAIR     1970's repair   WISDOM TEETH EXTRACTED 1968     AND ANOTHER WISDOM TOOTH REMOVED 2011    MEDICATIONS:  aspirin EC 81 MG tablet   latanoprost (XALATAN) 0.005 % ophthalmic solution   lisinopril (ZESTRIL) 20 MG tablet   sildenafil (REVATIO) 20 MG tablet   simvastatin  (ZOCOR ) 20 MG tablet   triamcinolone cream (KENALOG) 0.1 %   No current facility-administered medications for this encounter.   Burnard CHRISTELLA Odis DEVONNA [facility] Short Stay/Anesthesiology [facility] Phone [phone] 06/26/2024 1:12 PM

## 2024-06-26 NOTE — Anesthesia Preprocedure Evaluation (Addendum)
 Anesthesia Evaluation  Patient identified by MRN, date of birth, ID band Patient awake    Reviewed: Allergy & Precautions, H&P , NPO status , Patient's Chart, lab work & pertinent test results  History of Anesthesia Complications Negative for: history of anesthetic complications  Airway Mallampati: II  TM Distance: >3 FB Neck ROM: Full    Dental no notable dental hx.    Pulmonary former smoker   Pulmonary exam normal breath sounds clear to auscultation       Cardiovascular hypertension, Normal cardiovascular exam Rhythm:Regular Rate:Normal  HLD   Neuro/Psych neg Seizures negative neurological ROS  negative psych ROS   GI/Hepatic Neg liver ROS,,,Inguinal hernia   Endo/Other  negative endocrine ROS    Renal/GU negative Renal ROS   Hx of prostate cancer    Musculoskeletal negative musculoskeletal ROS (+)    Abdominal   Peds negative pediatric ROS (+)  Hematology negative hematology ROS (+)   Anesthesia Other Findings   Reproductive/Obstetrics negative OB ROS                              Anesthesia Physical Anesthesia Plan  ASA: 2  Anesthesia Plan: General and Regional   Post-op Pain Management: Regional block* and Tylenol  PO (pre-op)*   Induction: Intravenous  PONV Risk Score and Plan: 2 and Ondansetron , Dexamethasone  and Treatment may vary due to age or medical condition  Airway Management Planned: Oral ETT  Additional Equipment: None  Intra-op Plan:   Post-operative Plan: Extubation in OR  Informed Consent: I have reviewed the patients History and Physical, chart, labs and discussed the procedure including the risks, benefits and alternatives for the proposed anesthesia with the patient or authorized representative who has indicated his/her understanding and acceptance.     Dental advisory given  Plan Discussed with: CRNA  Anesthesia Plan Comments: (See PAT note  from 11/14)         Anesthesia Quick Evaluation

## 2024-07-03 ENCOUNTER — Ambulatory Visit (HOSPITAL_COMMUNITY): Payer: Self-pay | Admitting: Physician Assistant

## 2024-07-03 ENCOUNTER — Ambulatory Visit (HOSPITAL_COMMUNITY)
Admission: RE | Admit: 2024-07-03 | Discharge: 2024-07-03 | Disposition: A | Discharge status: Home / Self Care | Payer: Self-pay | Source: Ambulatory Visit | Attending: General Surgery | Admitting: General Surgery

## 2024-07-03 ENCOUNTER — Ambulatory Visit (HOSPITAL_COMMUNITY): Payer: Self-pay

## 2024-07-03 ENCOUNTER — Other Ambulatory Visit (HOSPITAL_COMMUNITY): Payer: Self-pay

## 2024-07-03 ENCOUNTER — Encounter (HOSPITAL_COMMUNITY)
Admission: RE | Disposition: A | Discharge status: Home / Self Care | Payer: Self-pay | Source: Ambulatory Visit | Attending: General Surgery

## 2024-07-03 ENCOUNTER — Encounter (HOSPITAL_COMMUNITY): Payer: Self-pay | Admitting: General Surgery

## 2024-07-03 DIAGNOSIS — K4090 Unilateral inguinal hernia, without obstruction or gangrene, not specified as recurrent: Secondary | ICD-10-CM | POA: Diagnosis present

## 2024-07-03 DIAGNOSIS — E785 Hyperlipidemia, unspecified: Secondary | ICD-10-CM | POA: Diagnosis not present

## 2024-07-03 DIAGNOSIS — I10 Essential (primary) hypertension: Secondary | ICD-10-CM | POA: Diagnosis not present

## 2024-07-03 DIAGNOSIS — Z87891 Personal history of nicotine dependence: Secondary | ICD-10-CM | POA: Insufficient documentation

## 2024-07-03 DIAGNOSIS — Z8546 Personal history of malignant neoplasm of prostate: Secondary | ICD-10-CM | POA: Insufficient documentation

## 2024-07-03 HISTORY — PX: XI ROBOTIC ASSISTED INGUINAL HERNIA REPAIR WITH MESH: SHX6706

## 2024-07-03 SURGERY — REPAIR, HERNIA, INGUINAL, ROBOT-ASSISTED, LAPAROSCOPIC, USING MESH
Anesthesia: Regional | Site: Abdomen | Laterality: Right

## 2024-07-03 MED ORDER — SODIUM CHLORIDE 0.9% FLUSH: 3.0000 mL | INTRAVENOUS | Status: DC | PRN

## 2024-07-03 MED ORDER — OXYCODONE HCL 5 MG PO TABS: 5.0000 mg | ORAL_TABLET | Freq: Once | ORAL | Status: DC | PRN

## 2024-07-03 MED ORDER — OXYCODONE HCL 5 MG/5ML PO SOLN: 5.0000 mg | Freq: Once | ORAL | Status: DC | PRN

## 2024-07-03 MED ORDER — SUGAMMADEX SODIUM 200 MG/2ML IV SOLN
INTRAVENOUS | Status: DC | PRN
  Administered 2024-07-03: 200 mg via INTRAVENOUS

## 2024-07-03 MED ORDER — DROPERIDOL 2.5 MG/ML IJ SOLN: 0.6250 mg | Freq: Once | INTRAMUSCULAR | Status: DC | PRN

## 2024-07-03 MED ORDER — PROPOFOL 10 MG/ML IV BOLUS
INTRAVENOUS | Status: DC | PRN
  Administered 2024-07-03: 150 mg via INTRAVENOUS
  Administered 2024-07-03 (×2): 50 mg via INTRAVENOUS

## 2024-07-03 MED ORDER — DEXAMETHASONE SOD PHOSPHATE PF 10 MG/ML IJ SOLN
INTRAMUSCULAR | Status: DC | PRN
  Administered 2024-07-03: 5 mg via INTRAVENOUS

## 2024-07-03 MED ORDER — BUPIVACAINE HCL (PF) 0.25 % IJ SOLN
INTRAMUSCULAR | Status: DC | PRN
Start: 2024-07-03 — End: 2024-07-03
  Administered 2024-07-03: 25 mL via PERINEURAL

## 2024-07-03 MED ORDER — PHENYLEPHRINE HCL-NACL 20-0.9 MG/250ML-% IV SOLN
INTRAVENOUS | Status: AC
  Filled 2024-07-03: qty 250

## 2024-07-03 MED ORDER — ACETAMINOPHEN 10 MG/ML IV SOLN
INTRAVENOUS | Status: AC
  Filled 2024-07-03: qty 100

## 2024-07-03 MED ORDER — FENTANYL CITRATE (PF) 100 MCG/2ML IJ SOLN
INTRAMUSCULAR | Status: AC
  Filled 2024-07-03: qty 2

## 2024-07-03 MED ORDER — EPHEDRINE SULFATE (PRESSORS) 25 MG/5ML IV SOSY
PREFILLED_SYRINGE | INTRAVENOUS | Status: DC | PRN
  Administered 2024-07-03: 20 mg via INTRAVENOUS

## 2024-07-03 MED ORDER — ACETAMINOPHEN 325 MG PO TABS: 650.0000 mg | ORAL_TABLET | ORAL | Status: DC | PRN

## 2024-07-03 MED ORDER — ACETAMINOPHEN 10 MG/ML IV SOLN: 1000.0000 mg | Freq: Once | INTRAVENOUS | Status: DC | PRN

## 2024-07-03 MED ORDER — IBUPROFEN 200 MG PO TABS
600.0000 mg | ORAL_TABLET | Freq: Four times a day (QID) | ORAL | 0 refills | Status: AC
  Filled 2024-07-03: qty 100, 9d supply, fill #0

## 2024-07-03 MED ORDER — ORAL CARE MOUTH RINSE: 15.0000 mL | Freq: Once | OROMUCOSAL | Status: AC

## 2024-07-03 MED ORDER — FENTANYL CITRATE (PF) 50 MCG/ML IJ SOSY: 25.0000 ug | PREFILLED_SYRINGE | INTRAMUSCULAR | Status: DC | PRN

## 2024-07-03 MED ORDER — 0.9 % SODIUM CHLORIDE (POUR BTL) OPTIME
TOPICAL | Status: DC | PRN
  Administered 2024-07-03: 1000 mL

## 2024-07-03 MED ORDER — BUPIVACAINE-EPINEPHRINE 0.25% -1:200000 IJ SOLN
INTRAMUSCULAR | Status: DC | PRN
  Administered 2024-07-03: 30 mL

## 2024-07-03 MED ORDER — CHLORHEXIDINE GLUCONATE CLOTH 2 % EX PADS: 6.0000 | MEDICATED_PAD | Freq: Once | CUTANEOUS | Status: DC

## 2024-07-03 MED ORDER — BUPIVACAINE-EPINEPHRINE (PF) 0.25% -1:200000 IJ SOLN
INTRAMUSCULAR | Status: AC
  Filled 2024-07-03: qty 30

## 2024-07-03 MED ORDER — LACTATED RINGERS IV SOLN: INTRAVENOUS | Status: DC

## 2024-07-03 MED ORDER — PHENYLEPHRINE 80 MCG/ML (10ML) SYRINGE FOR IV PUSH (FOR BLOOD PRESSURE SUPPORT)
PREFILLED_SYRINGE | INTRAVENOUS | Status: DC | PRN
  Administered 2024-07-03: 80 ug via INTRAVENOUS

## 2024-07-03 MED ORDER — SODIUM CHLORIDE 0.9% FLUSH: 3.0000 mL | Freq: Two times a day (BID) | INTRAVENOUS | Status: DC

## 2024-07-03 MED ORDER — PROPOFOL 10 MG/ML IV BOLUS
INTRAVENOUS | Status: AC
Start: 2024-07-03 — End: 2024-07-03
  Filled 2024-07-03: qty 20

## 2024-07-03 MED ORDER — PROPOFOL 10 MG/ML IV BOLUS
INTRAVENOUS | Status: AC
  Filled 2024-07-03: qty 20

## 2024-07-03 MED ORDER — ACETAMINOPHEN 325 MG PO TABS
650.0000 mg | ORAL_TABLET | Freq: Four times a day (QID) | ORAL | 0 refills | Status: AC
  Filled 2024-07-03: qty 50, 7d supply, fill #0

## 2024-07-03 MED ORDER — SODIUM CHLORIDE 0.9 % IV SOLN: 250.0000 mL | INTRAVENOUS | Status: DC | PRN

## 2024-07-03 MED ORDER — OXYCODONE HCL 5 MG PO TABS: 5.0000 mg | ORAL_TABLET | ORAL | Status: DC | PRN

## 2024-07-03 MED ORDER — CEFAZOLIN SODIUM-DEXTROSE 2-4 GM/100ML-% IV SOLN
2.0000 g | INTRAVENOUS | Status: AC
  Administered 2024-07-03: 2 g via INTRAVENOUS
  Filled 2024-07-03: qty 100

## 2024-07-03 MED ORDER — OXYCODONE HCL 5 MG PO TABS
5.0000 mg | ORAL_TABLET | Freq: Three times a day (TID) | ORAL | 0 refills | Status: AC | PRN
Start: 2024-07-03 — End: 2024-07-07
  Filled 2024-07-03: qty 12, 4d supply, fill #0

## 2024-07-03 MED ORDER — ACETAMINOPHEN 500 MG PO TABS
1000.0000 mg | ORAL_TABLET | ORAL | Status: AC
  Administered 2024-07-03: 1000 mg via ORAL
  Filled 2024-07-03: qty 2

## 2024-07-03 MED ORDER — LIDOCAINE HCL (PF) 2 % IJ SOLN
INTRAMUSCULAR | Status: AC
  Filled 2024-07-03: qty 5

## 2024-07-03 MED ORDER — ARTIFICIAL TEARS OPHTHALMIC OINT
TOPICAL_OINTMENT | OPHTHALMIC | Status: AC
  Filled 2024-07-03: qty 3.5

## 2024-07-03 MED ORDER — ROCURONIUM BROMIDE 10 MG/ML (PF) SYRINGE
PREFILLED_SYRINGE | INTRAVENOUS | Status: AC
  Filled 2024-07-03: qty 10

## 2024-07-03 MED ORDER — CHLORHEXIDINE GLUCONATE 0.12 % MT SOLN
15.0000 mL | Freq: Once | OROMUCOSAL | Status: AC
  Administered 2024-07-03: 15 mL via OROMUCOSAL

## 2024-07-03 MED ORDER — FENTANYL CITRATE (PF) 100 MCG/2ML IJ SOLN
INTRAMUSCULAR | Status: DC | PRN
  Administered 2024-07-03 (×3): 50 ug via INTRAVENOUS

## 2024-07-03 MED ORDER — ACETAMINOPHEN 650 MG RE SUPP: 650.0000 mg | RECTAL | Status: DC | PRN

## 2024-07-03 MED ORDER — ROCURONIUM BROMIDE 10 MG/ML (PF) SYRINGE
PREFILLED_SYRINGE | INTRAVENOUS | Status: DC | PRN
  Administered 2024-07-03: 10 mg via INTRAVENOUS
  Administered 2024-07-03: 20 mg via INTRAVENOUS
  Administered 2024-07-03: 60 mg via INTRAVENOUS
  Administered 2024-07-03: 20 mg via INTRAVENOUS

## 2024-07-03 MED ORDER — MORPHINE SULFATE (PF) 2 MG/ML IV SOLN: 1.0000 mg | INTRAVENOUS | Status: DC | PRN

## 2024-07-03 MED ORDER — PHENYLEPHRINE 80 MCG/ML (10ML) SYRINGE FOR IV PUSH (FOR BLOOD PRESSURE SUPPORT)
PREFILLED_SYRINGE | INTRAVENOUS | Status: AC
  Filled 2024-07-03: qty 10

## 2024-07-03 MED ORDER — ONDANSETRON HCL 4 MG/2ML IJ SOLN
INTRAMUSCULAR | Status: DC | PRN
  Administered 2024-07-03: 4 mg via INTRAVENOUS

## 2024-07-03 SURGICAL SUPPLY — 44 items
APPLICATOR COTTON TIP 6 STRL (MISCELLANEOUS) IMPLANT
BAG COUNTER SPONGE SURGICOUNT (BAG) IMPLANT
BLADE SURG SZ11 CARB STEEL (BLADE) ×2 IMPLANT
CHLORAPREP W/TINT 26 (MISCELLANEOUS) ×2 IMPLANT
COVER MAYO STAND STRL (DRAPES) ×2 IMPLANT
COVER SURGICAL LIGHT HANDLE (MISCELLANEOUS) ×2 IMPLANT
COVER TIP SHEARS 8 DVNC (MISCELLANEOUS) ×2 IMPLANT
DERMABOND ADVANCED .7 DNX12 (GAUZE/BANDAGES/DRESSINGS) ×2 IMPLANT
DRAPE ARM DVNC X/XI (DISPOSABLE) ×6 IMPLANT
DRAPE COLUMN DVNC XI (DISPOSABLE) ×2 IMPLANT
DRIVER NDL LRG 8 DVNC XI (INSTRUMENTS) IMPLANT
DRIVER NDL MEGA SUTCUT DVNCXI (INSTRUMENTS) IMPLANT
ELECT REM PT RETURN 15FT ADLT (MISCELLANEOUS) ×2 IMPLANT
FORCEPS BPLR 8 MD DVNC XI (FORCEP) ×2 IMPLANT
FORCEPS BPLR FENES DVNC XI (FORCEP) IMPLANT
GAUZE 4X4 16PLY ~~LOC~~+RFID DBL (SPONGE) ×2 IMPLANT
GLOVE BIO SURGEON STRL SZ7 (GLOVE) ×4 IMPLANT
GOWN STRL REUS W/ TWL XL LVL3 (GOWN DISPOSABLE) ×4 IMPLANT
IRRIGATION SUCT STRKRFLW 2 WTP (MISCELLANEOUS) IMPLANT
KIT BASIN OR (CUSTOM PROCEDURE TRAY) ×2 IMPLANT
KIT TURNOVER KIT A (KITS) ×2 IMPLANT
MARKER SKIN DUAL TIP RULER LAB (MISCELLANEOUS) ×2 IMPLANT
MESH 3DMAX MID 5X7 RT XLRG (Mesh General) IMPLANT
NDL HYPO 22X1.5 SAFETY MO (MISCELLANEOUS) ×2 IMPLANT
NDL INSUFFLATION 14GA 120MM (NEEDLE) ×2 IMPLANT
OBTURATOR OPTICALSTD 8 DVNC (TROCAR) ×2 IMPLANT
PACK CARDIOVASCULAR III (CUSTOM PROCEDURE TRAY) ×2 IMPLANT
SCISSORS MNPLR CVD DVNC XI (INSTRUMENTS) ×2 IMPLANT
SEAL UNIV 5-12 XI (MISCELLANEOUS) ×6 IMPLANT
SOLUTION ANTFG W/FOAM PAD STRL (MISCELLANEOUS) ×2 IMPLANT
SOLUTION ELECTROSURG ANTI STCK (MISCELLANEOUS) ×2 IMPLANT
SPIKE FLUID TRANSFER (MISCELLANEOUS) ×2 IMPLANT
SUT MNCRL AB 4-0 PS2 18 (SUTURE) ×2 IMPLANT
SUT VIC AB 2-0 SH 27X BRD (SUTURE) IMPLANT
SUT VIC AB 3-0 SH 27X BRD (SUTURE) ×2 IMPLANT
SUT VLOC 3-0 9IN GRN (SUTURE) ×2 IMPLANT
SUTURE VLOC BRB 180 ABS3/0GR12 (SUTURE) IMPLANT
SYR 10ML LL (SYRINGE) ×2 IMPLANT
SYR 20ML LL LF (SYRINGE) ×2 IMPLANT
TAPE STRIPS DRAPE STRL (GAUZE/BANDAGES/DRESSINGS) ×2 IMPLANT
TOWEL GREEN STERILE FF (TOWEL DISPOSABLE) ×2 IMPLANT
TOWEL OR 17X26 10 PK STRL BLUE (TOWEL DISPOSABLE) ×2 IMPLANT
TROCAR Z-THREAD OPTICAL 5X100M (TROCAR) IMPLANT
TUBING INSUFFLATION 10FT LAP (TUBING) ×2 IMPLANT

## 2024-07-03 NOTE — Discharge Instructions (Signed)

## 2024-07-03 NOTE — Op Note (Signed)
 Wesley Stanton ([id number])  Operative Report   Date 07/03/2024  PREOPERATIVE DIAGNOSIS: Right Inguinal hernia  POSTOPERATIVE DIAGNOSIS: Same  PROCEDURE:  Robotic Right Inguinal Hernia Repair with Mesh (Bard 3D Midweight XL Right Polypropylene Mesh)    SURGEON: Cordella Idler, MD  ANESTHESIA: General Anesthesia    INTRAOPERATIVE FINDINGS: Moderate sized indirect right inguinal hernia  IMPLANTS: Implant Name Type Inv. Item Serial No. Manufacturer Lot No. LRB No. Used Action  MESH 3DMAX MID 5X7 RT XLRG - [id number] Mesh General MESH 3DMAX MID 5X7 RT XLRG  [employer] BARD ACCESS [id number] Right 1 Implanted    ESTIMATED BLOOD LOSS: Minimal  COMPLICATIONS: None  SPECIMENS: None  OPERATIVE INDICATIONS: Pt is a 83 y.o. male who presents with a right inguinal hernia.  The patient desires definitive repair.  The procedure's risks, benefits, and alternatives were explained to the patient.  Risks, including the risks of bleeding, infection, need for mesh removal, and potential for hernia recurrence, were discussed.  The patient agreed to proceed and signed informed consent in front of a witness.   DESCRIPTION OF PROCEDURE: After preoperatively identifying the patient in holding, the patient was brought to the operating room and placed supine on the operating room table.  Both arms were tucked and padded to avoid potential nerve injury.  Sequential Compression Devices were placed bilaterally.  After induction of general anesthesia, the patient was given the appropriate perioperative antibiotics.  The abdomen was prepped and draped in a typical sterile fashion.  A JACHO approved time out, where the name of the patient, the operation, and the intended site were confirmed. The abdomen was accessed with a Veress needle via the standard drop technique in the LUQ at Palmer's point.  Insufflation was established.  An 8 mm optical port was placed under direct visualization in the RLQ.  A camera was  introduced into the abdomen, and a thorough inspection of the abdomen was performed to confirm there was no additional pathology.  Two additional 8 mm robotic ports were placed laterally under direct visualization, one superior to the umbilicus and another in the RLQ. The patient was then placed into 15-degree Trendelenburg position to facilitate examination of the bilateral inguinal spaces.  A thorough inspection of the abdomen was undertaken.  An indirect right inguinal hernia was identified and amenable to robotic repair.       The [employer] robot was brought into the field and docked.  An 8 mm 30-degree scope was placed in the mid-abdominal port.  The peritoneum was taken down 6 cm superior to the hernia from the anterior abdominal wall, and dissection was taken inferiorly towards the hernia.  Medial to the epigastric vessels, the parietal compartment was dissected and completed to visualize the rectus muscle.  This dissection was carried down to the symphysis pubis and obturator foramen.  The retropubic space was dissected to expose at least 2 cm contralateral to the midline.  Cooper's ligament was exposed and cleared at least 2 cm below the ligament to ensure adequate space for the inferior border of the mesh.  There was evidence of his prior prostatectomy with the flap in that area very adherent to surrounding structures and easy to tear. Hesselbach's triangle was cleared, assessing for a direct hernia. There was no direct or femoral hernia.  Lateral to the epigastric vessels, the dissection was carried out into the visceral compartment, continuing in the true preperitoneal plane.  The indirect hernia sac was carefully reduced and separated from the cord structures  with medial retraction and a combination of blunt/sharp dissection and focused cautery.  This dissection continued until the cord structures were parietalized completely, allowing for continuous visualization of the reflected peritoneum with the  line originating 2 cm below Cooper's medially and across the psoas muscle in the lateral compartment.   Having achieved a complete dissection with a critical view of the entire myopectineal orifice, a piece of Bard 3D Midweight XL Right Polypropylene Mesh was introduced into the field.  It was centered at the iliopubic tract, with the medial side crossing the midline and the inferior edge positioned 2 cm below Cooper's ligament.  With complete coverage of the myopectineal orifice, the inferior edge of the peritoneum was posterior and inferior to the mesh.  The lateral aspect of the mesh extended 3-5 cm beyond the lateral edge of the psoas.  Cephalad retraction of the peritoneal flap did not cause lifting of the inferior mesh edge or cord structures.  The mesh was fixated using an interrupted 3-0 Vicryl suture placed to the ipsilateral Coopers ligament.  The peritoneal flap was closed with a running 3-0 Stratafix barbed suture.  There were several holes that were closed with a running 3-0 stratafix suture. Hemostasis was assured in the entirety of the abdomen.  The two lateral ports were removed under direct visualization.  The abdomen was desufflated under direct visualization.  The port sites were closed, local anesthetic was infiltrated, and Dermabond was applied.  After confirming twice that the sponge, needle, and instrument counts were correct, the procedure was terminated, the patient was extubated and transferred to the recovery room in stable condition.     DISPOSITION: Stable to PACU.   Electronically Signed By: Cordella DELENA Idler 07/03/2024 9:37 AM

## 2024-07-03 NOTE — Addendum Note (Signed)
 Addendum  created 07/03/24 1207 by Franchot Delon RAMAN, CRNA   Flowsheet accepted

## 2024-07-03 NOTE — H&P (Signed)
 Wesley Stanton [birth year]  [id number].    HPI:  83 y/o M with a right inguinal hernia who presents for elective repair. He reports that he is in his usual state of health and denies any recent changes in medication.  ROS: Review of Systems  Constitutional: Negative.   HENT: Negative.    Eyes: Negative.   Respiratory: Negative.    Cardiovascular: Negative.   Gastrointestinal: Negative.   Genitourinary: Negative.   Musculoskeletal: Negative.   Skin: Negative.   Neurological: Negative.   Endo/Heme/Allergies: Negative.   Psychiatric/Behavioral: Negative.      Family History  Problem Relation Age of Onset   Ovarian cancer Mother    Prostate cancer Brother    Breast cancer Neg Hx    Colon cancer Neg Hx    Pancreatic cancer Neg Hx    Uterine cancer Neg Hx     Past Medical History:  Diagnosis Date   Aortic atherosclerosis    BPH (benign prostatic hyperplasia)    Carotid arterial disease    Deviated septum    PT STATES TOLD HE SNORES-TOLD HE HAS DEVIATED SEPTUM   Diverticulitis    OCTOBER 2013   ED (erectile dysfunction)    Hypercholesterolemia    Hypertension    Prostate cancer (HCC) 06/28/12   bx=Adenocarcinoma,PSA=2.94,gleason=3+4=7 & 4+3=7,volume=60.4cc   Urinary frequency    AND NOCTURIA    Past Surgical History:  Procedure Laterality Date   LYMPHADENECTOMY  08/11/2012   Procedure: LYMPHADENECTOMY;  Surgeon: Noretta Ferrara, MD;  Location[facility];  Service: Urology;  Laterality: Bilateral;   PROSTATE BIOPSY  06/28/12   Adenocarcinoma   ROBOT ASSISTED LAPAROSCOPIC RADICAL PROSTATECTOMY  08/11/2012   Procedure: ROBOTIC ASSISTED LAPAROSCOPIC RADICAL PROSTATECTOMY LEVEL 2;  Surgeon: Noretta Ferrara, MD;  Location[facility];  Service: Urology;  Laterality: N/A;      SKIN SURGERY  04/05/2019   TONSILLECTOMY     as a child   VARIOCOCELE REPAIR     1970's repair   WISDOM TEETH EXTRACTED 1968     AND ANOTHER WISDOM TOOTH REMOVED 2011    Social History:  reports  that he quit smoking about 34 years ago. His smoking use included cigarettes. He started smoking about 54 years ago. He has a 10 pack-year smoking history. He has never used smokeless tobacco. He reports current alcohol use. He reports that he does not use drugs.  Allergies:  Allergies  Allergen Reactions   Sulfa Antibiotics Other (See Comments)    High fever    Medications Prior to Admission  Medication Sig Dispense Refill   aspirin EC 81 MG tablet Take 81 mg by mouth in the morning. Swallow whole.     latanoprost (XALATAN) 0.005 % ophthalmic solution Place 1 drop into both eyes at bedtime.     lisinopril (ZESTRIL) 20 MG tablet Take 20 mg by mouth every morning.     sildenafil (REVATIO) 20 MG tablet Take 40-100 mg by mouth daily as needed (erectile dysfunction.).     simvastatin  (ZOCOR ) 20 MG tablet Take 20 mg by mouth at bedtime.     triamcinolone cream (KENALOG) 0.1 % Apply 1 Application topically 2 (two) times daily as needed (skin rash/irritation.).      Physical Exam: Blood pressure (!) 153/84, pulse 82, temperature 97.6 F (36.4 C), temperature source Oral, resp. rate 16, height 5' 10.5 (1.791 m), weight 69.4 kg, SpO2 97%. Gen: male, NAD Abd: soft, non-distended, R groin marked  No results found for this  or any previous visit (from the past 48 hours). No results found.  Assessment/Plan 83 y/o M with a right inguinal hernia  - Will proceed to the OR. We discussed the alternatives and potential risks of surgery, including but not limited to: bleeding, infection, damage to bowel or surrounding structures, damage to the vas/cord, mesh complications, chronic pain, recurrent hernia, and need for additional procedures. All questions were addressed and consent was obtained.    Cordella DELENA Polly Marlis Cheron Surgery 07/03/2024, 7:00 AM Please see Amion for pager number during day hours 7:00am-4:30pm or 7:00am -11:30am on weekends

## 2024-07-03 NOTE — Anesthesia Procedure Notes (Signed)
 Anesthesia Regional Block: TAP block   Pre-Anesthetic Checklist: , timeout performed,  Correct Patient, Correct Site, Correct Laterality,  Correct Procedure, Correct Position, site marked,  Risks and benefits discussed,  Surgical consent,  Pre-op evaluation,  At surgeon's request and post-op pain management  Laterality: Right  Prep: chloraprep       Needles:  Injection technique: Single-shot  Needle Type: Echogenic Stimulator Needle     Needle Length: 9cm  Needle Gauge: 21     Additional Needles:   Procedures:,,,, ultrasound used (permanent image in chart),,    Narrative:  Start time: 07/03/2024 7:14 AM End time: 07/03/2024 7:20 AM Injection made incrementally with aspirations every 5 mL.  Performed by: Personally  Anesthesiologist: Erma Thom SAUNDERS, MD  Additional Notes: Discussed risks and benefits of the nerve block in detail, including but not limited vascular injury, permanent nerve damage and infection.   Patient tolerated the procedure well. Local anesthetic introduced in an incremental fashion under minimal resistance after negative aspirations. No paresthesias were elicited. After completion of the procedure, no acute issues were identified and patient continued to be monitored by RN.

## 2024-07-03 NOTE — Anesthesia Postprocedure Evaluation (Signed)
 Anesthesia Post Note  Patient: Wesley Stanton  Procedure(s) Performed: REPAIR, HERNIA, INGUINAL, ROBOT-ASSISTED, LAPAROSCOPIC, USING MESH (Right: Abdomen)     Patient location during evaluation: PACU Anesthesia Type: Regional and General Level of consciousness: awake and alert Pain management: pain level controlled Vital Signs Assessment: post-procedure vital signs reviewed and stable Respiratory status: spontaneous breathing, nonlabored ventilation, respiratory function stable and patient connected to nasal cannula oxygen Cardiovascular status: blood pressure returned to baseline and stable Postop Assessment: no apparent nausea or vomiting Anesthetic complications: no   No notable events documented.  Last Vitals:  Vitals:   07/03/24 1015 07/03/24 1036  BP: (!) 153/77 (!) 165/86  Pulse: 89 86  Resp: 15 14  Temp: 36.6 C 36.6 C  SpO2: 94% 97%    Last Pain:  Vitals:   07/03/24 1036  TempSrc:   PainSc: 0-No pain                 Thom JONELLE Peoples

## 2024-07-03 NOTE — Anesthesia Procedure Notes (Signed)
 Procedure Name: Intubation Date/Time: 07/03/2024 7:45 AM  Performed by: Morton Houston, RNPre-anesthesia Checklist: Patient identified, Emergency Drugs available, Suction available and Patient being monitored Patient Re-evaluated:Patient Re-evaluated prior to induction Oxygen Delivery Method: Circle System Utilized Preoxygenation: Pre-oxygenation with 100% oxygen Induction Type: IV induction Ventilation: Mask ventilation without difficulty Laryngoscope Size: Mac and 4 Grade View: Grade I Tube type: Oral Tube size: 7.5 mm Number of attempts: 1 Airway Equipment and Method: Stylet Placement Confirmation: ETT inserted through vocal cords under direct vision, positive ETCO2 and breath sounds checked- equal and bilateral Secured at: 22 cm Tube secured with: Tape Dental Injury: Injury to lip  Comments: Intubation performed by Houston Morton, SRNA under supervision on Dr. Erma. Lip injury minor and treated with lip petroleum.

## 2024-07-03 NOTE — Transfer of Care (Addendum)
 Immediate Anesthesia Transfer of Care Note  Patient: Wesley Stanton  Procedure(s) Performed: REPAIR, HERNIA, INGUINAL, ROBOT-ASSISTED, LAPAROSCOPIC, USING MESH (Right: Abdomen)  Patient Location: PACU  Anesthesia Type:General  Level of Consciousness: awake and oriented  Airway & Oxygen Therapy: Patient Spontanous Breathing and Patient connected to face mask oxygen  Post-op Assessment: Report given to RN and Post -op Vital signs reviewed and stable  Post vital signs: Reviewed and stable  Last Vitals:  Vitals Value Taken Time  BP 157/87 07/03/24 09:46  Temp 36.7 C 07/03/24 09:45  Pulse 92 07/03/24 09:48  Resp 14 07/03/24 09:48  SpO2 99 % 07/03/24 09:48  Vitals shown include unfiled device data.  Last Pain:  Vitals:   07/03/24 0945  TempSrc:   PainSc: 0-No pain         Complications: No notable events documented.

## 2024-07-04 ENCOUNTER — Encounter (HOSPITAL_COMMUNITY): Payer: Self-pay | Admitting: General Surgery
